# Patient Record
Sex: Female | Born: 1985 | Race: White | Hispanic: No | Marital: Married | Smoking: Current every day smoker
Health system: Southern US, Community
[De-identification: ages and names within clinical notes are randomized; demographics above are authoritative.]

## PROBLEM LIST (undated history)

## (undated) DIAGNOSIS — M1990 Unspecified osteoarthritis, unspecified site: Secondary | ICD-10-CM

## (undated) DIAGNOSIS — K5792 Diverticulitis of intestine, part unspecified, without perforation or abscess without bleeding: Secondary | ICD-10-CM

## (undated) HISTORY — DX: Unspecified osteoarthritis, unspecified site: M19.90

## (undated) HISTORY — PX: NO PAST SURGERIES: SHX2092

## (undated) HISTORY — DX: Diverticulitis of intestine, part unspecified, without perforation or abscess without bleeding: K57.92

---

## 2021-03-20 ENCOUNTER — Emergency Department

## 2021-03-20 ENCOUNTER — Emergency Department
Admission: EM | Admit: 2021-03-20 | Discharge: 2021-03-20 | Disposition: A | Discharge status: Home / Self Care | Attending: Emergency Medicine | Admitting: Emergency Medicine

## 2021-03-20 ENCOUNTER — Other Ambulatory Visit: Payer: Self-pay

## 2021-03-20 ENCOUNTER — Encounter: Payer: Self-pay | Admitting: Emergency Medicine

## 2021-03-20 DIAGNOSIS — W01198A Fall on same level from slipping, tripping and stumbling with subsequent striking against other object, initial encounter: Secondary | ICD-10-CM | POA: Diagnosis not present

## 2021-03-20 DIAGNOSIS — S40011A Contusion of right shoulder, initial encounter: Secondary | ICD-10-CM | POA: Insufficient documentation

## 2021-03-20 DIAGNOSIS — Y990 Civilian activity done for income or pay: Secondary | ICD-10-CM | POA: Insufficient documentation

## 2021-03-20 DIAGNOSIS — F17210 Nicotine dependence, cigarettes, uncomplicated: Secondary | ICD-10-CM | POA: Diagnosis not present

## 2021-03-20 DIAGNOSIS — S8001XA Contusion of right knee, initial encounter: Secondary | ICD-10-CM | POA: Insufficient documentation

## 2021-03-20 DIAGNOSIS — S8991XA Unspecified injury of right lower leg, initial encounter: Secondary | ICD-10-CM | POA: Diagnosis present

## 2021-03-20 MED ORDER — IBUPROFEN 400 MG PO TABS
400.0000 mg | ORAL_TABLET | Freq: Once | ORAL | Status: AC
  Administered 2021-03-20: 400 mg via ORAL
  Filled 2021-03-20: qty 1

## 2021-03-20 MED ORDER — ACETAMINOPHEN 500 MG PO TABS
1000.0000 mg | ORAL_TABLET | Freq: Once | ORAL | Status: AC
  Administered 2021-03-20: 1000 mg via ORAL
  Filled 2021-03-20: qty 2

## 2021-03-20 NOTE — ED Notes (Signed)
Pt states this incident is workers comp, per profile nothing is needed only upon request. Pt states supervisor did not need any testing done at this time.

## 2021-03-20 NOTE — ED Triage Notes (Signed)
Pt comes into the ED via POV c/o fall at work.  Pt states she was getting chased by a dog and tripped backwards and fell into a tree.  Pt c/o right shoulder and right knee pain.  Pt ambulatory to triage with no deformities noted.

## 2021-03-20 NOTE — ED Provider Notes (Signed)
[facility] Emergency Department Provider Note  ____________________________________________   Event Date/Time   First MD Initiated Contact with Patient 03/20/21 1355     (approximate)  I have reviewed the triage vital signs and the nursing notes.   HISTORY  Chief Complaint Fall   HPI Allison Navarro is a 36 y.o. female without significant past medical history who presents for assessment of some right knee and right shoulder pain after she tripped and fell trying to escape from a dog chasing her earlier today.  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  She thinks she may have tripped on a log that she fell onto her right knee and the back of her right shoulder.  She does not think her hit her head and did not have LOC.  She does not have any neck pain or other back pain or other extremity pain.  She states that prior to that she was in her usual state of health without any recent fevers, chills, cough, nausea, vomiting, Derica dysuria, rash or any other recent sick symptoms or injuries or falls.  She is not any blood thinners and does not take any analgesia prior to arrival.       History reviewed. No pertinent past medical history.  There are no problems to display for this patient.   History reviewed. No pertinent surgical history.  Prior to Admission medications   Not on File    Allergies Patient has no known allergies.  History reviewed. No pertinent family history.  Social History Social History   Tobacco Use   Smoking status: Every Day    Pack years: 0.00    Types: Cigarettes   Smokeless tobacco: Never    Review of Systems  Review of Systems  Constitutional:  Negative for chills and fever.  HENT:  Negative for sore throat.   Eyes:  Negative for pain.  Respiratory:  Negative for cough and stridor.   Cardiovascular:  Negative for chest pain.  Gastrointestinal:   Negative for vomiting.  Genitourinary:  Negative for dysuria.  Musculoskeletal:  Positive for joint pain (R knee, R shoulder) and myalgias (R shoulder, R knee).  Skin:  Negative for rash.  Neurological:  Negative for seizures, loss of consciousness and headaches.  Psychiatric/Behavioral:  Negative for suicidal ideas.   All other systems reviewed and are negative.    ____________________________________________   PHYSICAL EXAM:  VITAL SIGNS: ED Triage Vitals  Enc Vitals Group     BP 03/20/21 1345 (!) 143/81     Pulse Rate 03/20/21 1345 73     Resp 03/20/21 1345 18     Temp 03/20/21 1345 98.4 F (36.9 C)     Temp Source 03/20/21 1345 Oral     SpO2 03/20/21 1345 98 %     Weight 03/20/21 1345 165 lb (74.8 kg)     Height 03/20/21 1345 5' (1.524 m)     Head Circumference --      Peak Flow --      Pain Score 03/20/21 1345 8     Pain Loc --      Pain Edu? --      Excl. in [place]? --    Vitals:   03/20/21 1345  BP: (!) 143/81  Pulse: 73  Resp: 18  Temp: 98.4 F (36.9 C)  SpO2: 98%   Physical Exam Vitals and nursing note reviewed.  Constitutional:  General: She is not in acute distress.    Appearance: She is well-developed.  HENT:     Head: Normocephalic and atraumatic.     Right Ear: External ear normal.     Left Ear: External ear normal.     Nose: Nose normal.  Eyes:     Conjunctiva/sclera: Conjunctivae normal.  Cardiovascular:     Rate and Rhythm: Normal rate and regular rhythm.     Heart sounds: No murmur heard. Pulmonary:     Effort: Pulmonary effort is normal. No respiratory distress.     Breath sounds: Normal breath sounds.  Abdominal:     Palpations: Abdomen is soft.     Tenderness: There is no abdominal tenderness.  Musculoskeletal:     Cervical back: Neck supple.  Skin:    General: Skin is warm and dry.     Capillary Refill: Capillary refill takes less than 2 seconds.  Neurological:     Mental Status: She is alert and oriented to person, place, and  time.    No tenderness step-offs or deformities over the C/T/L-spine.  2+ radial pulses.  Patient has full strength on her bilateral upper and lower extremities although she is from out of pain on ranging her right arm overhead.  She is tender over her posterior right shoulder a little bit over the [facility] joint but there is no deformity effusion or other evidence of trauma.  Sensation is intact light touch throughout all extremities.  Patient has some mild tenderness over her medial collateral ligaments on the right knee without any laxity on valgus, varus or anterior posterior drawer testing.  No other obvious trauma to the lower extremities. ____________________________________________   LABS (all labs ordered are listed, but only abnormal results are displayed)  Labs Reviewed - No data to display ____________________________________________  EKG  ____________________________________________  RADIOLOGY  ED MD interpretation: No evidence of fracture dislocation or other clear acute injury on plain film of the right shoulder and right knee.  Official radiology report(s): DG Shoulder Right  Result Date: 03/20/2021 CLINICAL DATA:  Provided history: Right shoulder pain. Additional history provided: Patient reports fall hitting right shoulder and twisting right knee. Patient reports pain to anterior right knee and posterior right shoulder. EXAM: RIGHT SHOULDER - 2+ VIEW COMPARISON:  No pertinent prior exams available for comparison. FINDINGS: There is normal bony alignment. No evidence of acute osseous or articular abnormality. The joint spaces are maintained. IMPRESSION: No evidence of acute osseous or articular abnormality. Electronically Signed   By: Kellie Simmering DO   On: 03/20/2021 14:44   DG Knee Complete 4 Views Right  Result Date: 03/20/2021 CLINICAL DATA:  Right shoulder pain. Additional history provided: Patient reports fall hitting right shoulder and twisting right knee. Patient reports  pain to anterior right knee and posterior right shoulder. EXAM: RIGHT KNEE - COMPLETE 4+ VIEW COMPARISON:  No pertinent prior exams available for comparison. FINDINGS: There is normal bony alignment. No evidence of acute osseous or articular abnormality. The joint spaces are maintained. IMPRESSION: No evidence of acute osseous or articular abnormality. Electronically Signed   By: Kellie Simmering DO   On: 03/20/2021 14:47    ____________________________________________   PROCEDURES  Procedure(s) performed (including Critical Care):  Procedures   ____________________________________________   INITIAL IMPRESSION / ASSESSMENT AND PLAN / ED COURSE      Patient presents with above-stated history exam for some soreness in her right shoulder which she fell onto in her right knee after ground-level mechanical  fall described above.  On arrival she is afebrile and hemodynamically stable.  She does have little tenderness in the medial aspect of the right knee and the posterior shoulder pain on passive range of motion but otherwise is neurovascularly intact without other evidence of trauma.  Plain films are unremarkable.  Suspect contusion.  Low suspicion for occult orthopedic or any significant other visceral injury.  Patient given Tylenol and ibuprofen and instructed follow-up with PCP as needed for any persistent soreness.  Discharged stable condition.  Strict return precautions advised and discussed.       ____________________________________________   FINAL CLINICAL IMPRESSION(S) / ED DIAGNOSES  Final diagnoses:  Contusion of right knee, initial encounter  Contusion of right shoulder, initial encounter    Medications  acetaminophen (TYLENOL) tablet 1,000 mg (1,000 mg Oral Given 03/20/21 1448)  ibuprofen (ADVIL) tablet 400 mg (400 mg Oral Given 03/20/21 1448)     ED Discharge Orders     None        Note:  This document was prepared using Dragon voice recognition software and may  include unintentional dictation errors.    Lucrezia Starch, MD 03/20/21 1520

## 2021-04-18 ENCOUNTER — Ambulatory Visit: Payer: BLUE CROSS/BLUE SHIELD | Admitting: Family Medicine

## 2021-10-08 ENCOUNTER — Ambulatory Visit (INDEPENDENT_AMBULATORY_CARE_PROVIDER_SITE_OTHER): Payer: BLUE CROSS/BLUE SHIELD | Admitting: Internal Medicine

## 2021-10-08 ENCOUNTER — Other Ambulatory Visit: Payer: Self-pay

## 2021-10-08 ENCOUNTER — Encounter: Payer: Self-pay | Admitting: Internal Medicine

## 2021-10-08 VITALS — BP 122/77 | HR 66 | Ht 59.0 in

## 2021-10-08 DIAGNOSIS — Z124 Encounter for screening for malignant neoplasm of cervix: Secondary | ICD-10-CM

## 2021-10-08 DIAGNOSIS — K5792 Diverticulitis of intestine, part unspecified, without perforation or abscess without bleeding: Secondary | ICD-10-CM | POA: Diagnosis not present

## 2021-10-08 MED ORDER — GLYCOPYRROLATE 2 MG PO TABS: 2.0000 mg | ORAL_TABLET | Freq: Three times a day (TID) | ORAL | 3 refills | Status: DC

## 2021-10-08 NOTE — Assessment & Plan Note (Signed)
Refer to GI 

## 2021-10-08 NOTE — Assessment & Plan Note (Signed)
Refer to OB/GYN 

## 2021-10-08 NOTE — Progress Notes (Signed)
New Patient Office Visit  Subjective:  Patient ID: Allison Navarro, female    DOB: 16-Mar-1986  Age: 36 y.o. MRN: [id number]  CC:  Chief Complaint  Patient presents with   New Patient (Initial Visit)    Abdominal Pain This is a chronic problem. The current episode started 1 to 4 weeks ago. The problem has been gradually improving. The pain is at a severity of 7/10. The pain is moderate. The quality of the pain is dull and aching. The abdominal pain radiates to the LLQ. Associated symptoms include belching, constipation and flatus. Pertinent negatives include no anorexia, fever, headaches, hematochezia, hematuria or weight loss.  Patient presents for new pt visit  Past Medical History:  Diagnosis Date   Diverticulitis      Current Outpatient Medications:    glycopyrrolate (ROBINUL) 2 MG tablet, Take 1 tablet (2 mg total) by mouth 3 (three) times daily., Disp: 90 tablet, Rfl: 3   History reviewed. No pertinent surgical history.  History reviewed. No pertinent family history.  Social History   Socioeconomic History   Marital status: Married    Spouse name: Biomedical engineer   Number of children: 2   Years of education: Not on file   Highest education level: GED or equivalent  Occupational History   Not on file  Tobacco Use   Smoking status: Every Day    Types: Cigarettes   Smokeless tobacco: Never  Substance and Sexual Activity   Alcohol use: Never   Drug use: Never   Sexual activity: Yes  Other Topics Concern   Not on file  Social History Narrative   Not on file   Social Determinants of Health   Financial Resource Strain: Not on file  Food Insecurity: Not on file  Transportation Needs: Not on file  Physical Activity: Not on file  Stress: Not on file  Social Connections: Not on file  Intimate Partner Violence: Not on file    ROS Review of Systems  Constitutional:  Negative for fever and weight loss.  Gastrointestinal:  Positive for abdominal pain,  constipation and flatus. Negative for anorexia and hematochezia.  Genitourinary:  Negative for hematuria.  Neurological:  Negative for headaches.   Objective:   Today's Vitals: BP 122/77    Pulse 66    Ht 4\' 11"  (1.499 m)    BMI 33.33 kg/m   Physical Exam Constitutional:      Appearance: Normal appearance. She is not ill-appearing.  HENT:     Head: Normocephalic.     Nose: Nose normal.     Mouth/Throat:     Mouth: Mucous membranes are moist.  Eyes:     Pupils: Pupils are equal, round, and reactive to light.  Cardiovascular:     Rate and Rhythm: Normal rate.  Pulmonary:     Effort: Pulmonary effort is normal.  Abdominal:     General: Abdomen is flat.     Palpations: Abdomen is soft.  Musculoskeletal:     Cervical back: Neck supple.  Skin:    General: Skin is warm.  Neurological:     Mental Status: She is alert.  Psychiatric:        Mood and Affect: Mood normal.    Assessment & Plan:   Problem List Items Addressed This Visit       Other   Cervical cancer screening - Primary    Refer to OB/GYN      Relevant Orders   Ambulatory referral to Obstetrics / Gynecology  Diverticulitis    Refer to GI      Relevant Orders   Ambulatory referral to Gastroenterology    Outpatient Encounter Medications as of 10/08/2021  Medication Sig   [DISCONTINUED] glycopyrrolate (ROBINUL) 2 MG tablet Take by mouth 3 (three) times daily.   glycopyrrolate (ROBINUL) 2 MG tablet Take 1 tablet (2 mg total) by mouth 3 (three) times daily.   No facility-administered encounter medications on file as of 10/08/2021.    Follow-up: No follow-ups on file.   Cletis Athens, MD

## 2021-11-26 ENCOUNTER — Ambulatory Visit: Payer: BLUE CROSS/BLUE SHIELD | Admitting: Obstetrics

## 2021-11-26 ENCOUNTER — Other Ambulatory Visit (HOSPITAL_COMMUNITY)
Admission: RE | Admit: 2021-11-26 | Discharge: 2021-11-26 | Disposition: A | Discharge status: Home / Self Care | Payer: BLUE CROSS/BLUE SHIELD | Source: Ambulatory Visit | Attending: Obstetrics | Admitting: Obstetrics

## 2021-11-26 ENCOUNTER — Encounter: Payer: Self-pay | Admitting: Obstetrics

## 2021-11-26 ENCOUNTER — Other Ambulatory Visit: Payer: Self-pay

## 2021-11-26 VITALS — BP 104/72 | Ht 60.0 in | Wt 160.0 lb

## 2021-11-26 DIAGNOSIS — Z01419 Encounter for gynecological examination (general) (routine) without abnormal findings: Secondary | ICD-10-CM

## 2021-11-26 DIAGNOSIS — D229 Melanocytic nevi, unspecified: Secondary | ICD-10-CM | POA: Diagnosis not present

## 2021-11-26 NOTE — Progress Notes (Signed)
SUBJECTIVE ? ?HPI ? ?Allison Navarro is a 36 y.o.-year-old female who presents for an annual gynecological exam and Pap today. She was recently seen by her PCP. [place] and would like to establish care. She currently has a Mirena IUD for contraception and is pleased with that. She does not get periods. She denies any unusual vaginal bleeding, discharge, pelvic pain, and any other health concerns. She would like routine labs. ? ?Medical/Surgical History ?Past Medical History:  ?Diagnosis Date  ? Diverticulitis   ? ?No past surgical history on file. ? ?Social History ?Lives with husband and 2 children ?Works as a Development worker, community carrier ?Exercise: walking at work ?Substances: Occasional EtOH; smokes "less than a pack a day", denies smokeless tobacco, vape, recreational drugs ? ?Obstetric History ?OB History   ? ? Gravida  ?2  ? Para  ?2  ? Term  ?2  ? Preterm  ?   ? AB  ?   ? Living  ?   ?  ? ? SAB  ?   ? IAB  ?   ? Ectopic  ?   ? Multiple  ?   ? Live Births  ?2  ?   ?  ?  ?  ? ?GYN/Menstrual History ?No LMP recorded. (Menstrual status: IUD). ?Last Pap: unsure. Distant history of abnormal Pap, more recent ones have been normal ?Contraception: Mirena IUD ? ?Prevention ?Encouraged regular dental and eye exams ?Mammogram: at 51 ?STI screening: declines ? ?Current Medications ?Outpatient Medications Prior to Visit  ?Medication Sig  ? glycopyrrolate (ROBINUL) 2 MG tablet Take 1 tablet (2 mg total) by mouth 3 (three) times daily.  ? levonorgestrel (MIRENA) 20 MCG/DAY IUD 1 each by Intrauterine route once.  ? ?No facility-administered medications prior to visit.  ?  ? ? [facility] - 11/26/21 1009   ? ?  ? Pregnancy Intention Screening  ? Does the patient want to become pregnant in the next year? No   ? Does the patient's partner want to become pregnant in the next year? No   ? Would the patient like to discuss contraceptive options today? No   ?  ? Contraception Wrap Up  ? Current Method IUD or IUS   ? End Method  IUD or IUS   ? ?  ?  ? ?  ? ?The pregnancy intention screening data noted above was reviewed. Potential methods of contraception were discussed. The patient elected to proceed with IUD or IUS.  ? ?ROS ?History obtained from the patient ?General ROS: negative for - chills, fever, or sleep disturbance ?Psychological ROS: negative for - anxiety or depression ?Ophthalmic ROS: negative for - blurry vision or loss of vision ?Hematological and Lymphatic ROS: negative for - swollen lymph nodes ?Endocrine ROS: negative for - breast changes, palpitations, or unexpected weight changes ?Breast ROS: negative for breast lumps ?Respiratory ROS: no cough, shortness of breath, or wheezing ?Cardiovascular ROS: no chest pain or dyspnea on exertion ?Gastrointestinal ROS: no abdominal pain, change in bowel habits, or black or bloody stools ?Has flare-ups r/t diverticulitis about once a year ?Genito-Urinary ROS: no dysuria, trouble voiding, or hematuria ?Musculoskeletal ROS: negative for - joint pain or joint swelling ?Dermatological ROS: negative for dry skin and eczema ? ?Depression screen [facility] 2/9 10/08/2021  ?Decreased Interest 0  ?Down, Depressed, Hopeless 0  ?PHQ - 2 Score 0  ?  ? ?OBJECTIVE ? ?Last Weight  Most recent update: 11/26/2021 10:08 AM  ? ? Weight  ?72.6  kg (160 lb)  ?      ? ?  ?  ?Body mass index is 31.25 kg/m?.  ? ? ?BP 104/72   Ht 5' (1.524 m)   Wt 160 lb (72.6 kg)   BMI 31.25 kg/m?  ?General appearance: alert, cooperative, and appears stated age ?Head: Normocephalic, without obvious abnormality, atraumatic ?Eyes: negative findings: lids and lashes normal and conjunctivae and sclerae normal ?Neck: no adenopathy, supple, symmetrical, trachea midline, and thyroid not enlarged, symmetric, no tenderness/mass/nodules ?Back: symmetric, no curvature. ROM normal. No CVA tenderness. ?Lungs: clear to auscultation bilaterally ?Breasts: normal appearance, no masses or tenderness, No nipple retraction or dimpling, No axillary or  supraclavicular adenopathy, Normal to palpation without dominant masses ?Heart: regular rate and rhythm, S1, S2 normal, no murmur, click, rub or gallop ?Abdomen: soft, non-tender; bowel sounds normal; no masses,  no organomegaly ?Pelvic: cervix normal in appearance, external genitalia normal, no cervical motion tenderness, vagina normal without discharge, and Pap collected ?Extremities: extremities normal, atraumatic, no cyanosis or edema ?Pulses: 2+ and symmetric ?Skin:  Skin color, texture, and turgor normal. No rashes. Small, flat,dark multicolored (dark brown, dark blue) nevus noted on left hand. ?Lymph nodes: Cervical, supraclavicular, and axillary nodes normal. ? ?ASSESSMENT  ?1) Annual exam ?2) Pap due ?3) Suspicious mole ? ?PLAN ?1) Physical exam as noted. Pap collected. CBC, A1C, lipid panel, and TSH ordered. ?2) Pap collected. F/u based on results. ?3) Referral to dermatology. ? ?Return in one year for annual exam or as needed for concerns. ? ? ?Lloyd Huger, CNM  ?

## 2021-11-27 LAB — CBC WITH DIFFERENTIAL/PLATELET
Basophils Absolute: 0.1 10*3/uL (ref 0.0–0.2)
Basos: 1 %
EOS (ABSOLUTE): 0.2 10*3/uL (ref 0.0–0.4)
Eos: 2 %
Hematocrit: 45 % (ref 34.0–46.6)
Hemoglobin: 15.1 g/dL (ref 11.1–15.9)
Immature Grans (Abs): 0 10*3/uL (ref 0.0–0.1)
Immature Granulocytes: 0 %
Lymphocytes Absolute: 3.1 10*3/uL (ref 0.7–3.1)
Lymphs: 36 %
MCH: 30.6 pg (ref 26.6–33.0)
MCHC: 33.6 g/dL (ref 31.5–35.7)
MCV: 91 fL (ref 79–97)
Monocytes Absolute: 0.7 10*3/uL (ref 0.1–0.9)
Monocytes: 8 %
Neutrophils Absolute: 4.4 10*3/uL (ref 1.4–7.0)
Neutrophils: 53 %
Platelets: 372 10*3/uL (ref 150–450)
RBC: 4.93 x10E6/uL (ref 3.77–5.28)
RDW: 12.1 % (ref 11.7–15.4)
WBC: 8.4 10*3/uL (ref 3.4–10.8)

## 2021-11-27 LAB — LIPID PANEL
Chol/HDL Ratio: 6.1 ratio — ABNORMAL HIGH (ref 0.0–4.4)
Cholesterol, Total: 194 mg/dL (ref 100–199)
HDL: 32 mg/dL — ABNORMAL LOW (ref >39)
LDL Chol Calc (NIH): 145 mg/dL — ABNORMAL HIGH (ref 0–99)
Triglycerides: 90 mg/dL (ref 0–149)
VLDL Cholesterol Cal: 17 mg/dL (ref 5–40)

## 2021-11-27 LAB — TSH: TSH: 1.46 u[IU]/mL (ref 0.450–4.500)

## 2021-11-28 ENCOUNTER — Encounter: Payer: Self-pay | Admitting: Obstetrics

## 2021-11-28 LAB — CYTOLOGY - PAP
Adequacy: ABSENT
Comment: NEGATIVE
High risk HPV: NEGATIVE

## 2021-12-01 ENCOUNTER — Encounter: Payer: Self-pay | Admitting: Gastroenterology

## 2021-12-01 ENCOUNTER — Other Ambulatory Visit: Payer: Self-pay

## 2021-12-01 ENCOUNTER — Ambulatory Visit (INDEPENDENT_AMBULATORY_CARE_PROVIDER_SITE_OTHER): Payer: BLUE CROSS/BLUE SHIELD | Admitting: Gastroenterology

## 2021-12-01 VITALS — BP 108/69 | HR 61 | Temp 98.1°F | Ht 60.0 in | Wt 164.0 lb

## 2021-12-01 DIAGNOSIS — K5732 Diverticulitis of large intestine without perforation or abscess without bleeding: Secondary | ICD-10-CM | POA: Diagnosis not present

## 2021-12-01 MED ORDER — NA SULFATE-K SULFATE-MG SULF 17.5-3.13-1.6 GM/177ML PO SOLN: 1.0000 | Freq: Once | ORAL | 0 refills | Status: AC

## 2021-12-01 NOTE — Progress Notes (Signed)
? ? ?Gastroenterology Consultation ? ?Referring Provider:     Cletis Athens, MD ?Primary Care Physician:  Pcp, No ?Primary Gastroenterologist:  Dr. Allen Navarro     ?Reason for Consultation:     Diverticulitis ?      ? HPI:   ?Allison Navarro is a 36 y.o. y/o female referred for consultation & management of Diverticulitis by Dr. Merryl Navarro, No.  This patient comes in today with a history of diverticulitis.  The patient denies ever having colonoscopy in the past and cannot remember having a CT scan when she had one of her spells of diverticulitis.  The patient states that she has one attack a year on average.  She also states she has been stable way from seeds and nuts and popcorn.  She had her last attack this past December.  There is no report of any unexplained weight loss fevers chills nausea vomiting black stools or bloody stools.  She does report that she passes some mucous-like material every once in a while.  There is no report of any fevers or chills at the present time. 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 ? ?Past Medical History:  ?Diagnosis Date  ? Diverticulitis   ? ? ?No past surgical history on file. ? ?Prior to Admission medications   ?Medication Sig Start Date End Date Taking? Authorizing Provider  ?glycopyrrolate (ROBINUL) 2 MG tablet Take 1 tablet (2 mg total) by mouth 3 (three) times daily. 10/08/21  Yes Allison Navarro, Allison Shove, MD  ?levonorgestrel (MIRENA) 20 MCG/DAY IUD 1 each by Intrauterine route once.   Yes [provider]  ? ? ?No family history on file.  ? ?Social History  ? ?Tobacco Use  ? Smoking status: Every Day  ?  Types: Cigarettes  ? Smokeless tobacco: Never  ?Substance Use Topics  ? Alcohol use: Not Currently  ? Drug use: Never  ? ? ?Allergies as of 12/01/2021  ? (No Known Allergies)  ? ? ?Review of Systems:    ?All systems reviewed and negative except where noted in HPI. ? ? Physical Exam:  ?BP 108/69   Pulse 61   Temp 98.1 ?F (36.7 ?C)  (Oral)   Ht 5' (1.524 m)   Wt 164 lb (74.4 kg)   BMI 32.03 kg/m?  ?No LMP recorded. (Menstrual status: IUD). ?General:   Alert,  Well-developed, well-nourished, pleasant and cooperative in NAD ?Head:  Normocephalic and atraumatic. ?Eyes:  Sclera clear, no icterus.   Conjunctiva pink. ?Ears:  Normal auditory acuity. ?Neck:  Supple; no masses or thyromegaly. ?Lungs:  Respirations even and unlabored.  Clear throughout to auscultation.   No wheezes, crackles, or rhonchi. No acute distress. ?Heart:  Regular rate and rhythm; no murmurs, clicks, rubs, or gallops. ?Abdomen:  Normal bowel sounds.  No bruits.  Soft, non-tender and non-distended without masses, hepatosplenomegaly or hernias noted.  No guarding or rebound tenderness.  Negative Carnett sign.   ?Rectal:  Deferred.  ?Pulses:  Normal pulses noted. ?Extremities:  No clubbing or edema.  No cyanosis. ?Neurologic:  Alert and oriented x3;  grossly normal neurologically. ?Skin:  Intact without significant lesions or rashes.  No jaundice. ?Lymph Nodes:  No significant cervical adenopathy. ?Psych:  Alert and cooperative. Normal mood and affect. ? ?Imaging Studies: ?No results found. ? ?Assessment and Plan:  ? ?Allison Navarro is a 36 y.o. y/o female who comes in today with a history of recurrent diverticulitis without any history of colonoscopies  in the past or imaging that she can recall.  The patient has been told that she should be set up for colonoscopy if she is never had a colonoscopy to make sure that it is just diverticulosis and not something else is causing her pain.  She has also been told that we can also try and assess the extent of the diverticulosis to see if his pan colitis or just the left side.  The patient has also been told that she may need a CT scan at the time of the next attack so that we can confirm that that is actually the cause of her symptoms.  The patient has been explained the plan and will follow up at time of colonoscopy. ? ? ? ?Allison Lame, MD. Allison Navarro ? ? ? Note: This dictation was prepared with Dragon dictation along with smaller phrase technology. Any transcriptional errors that result from this process are unintentional.   ?

## 2022-01-06 ENCOUNTER — Ambulatory Visit: Payer: BLUE CROSS/BLUE SHIELD | Admitting: Certified Registered"

## 2022-01-06 ENCOUNTER — Ambulatory Visit
Admission: RE | Admit: 2022-01-06 | Discharge: 2022-01-06 | Disposition: A | Discharge status: Home / Self Care | Payer: BLUE CROSS/BLUE SHIELD | Attending: Gastroenterology | Admitting: Gastroenterology

## 2022-01-06 ENCOUNTER — Other Ambulatory Visit: Payer: Self-pay

## 2022-01-06 ENCOUNTER — Encounter: Payer: Self-pay | Admitting: Gastroenterology

## 2022-01-06 ENCOUNTER — Encounter
Admission: RE | Disposition: A | Discharge status: Home / Self Care | Payer: Self-pay | Source: Home / Self Care | Attending: Gastroenterology

## 2022-01-06 DIAGNOSIS — K635 Polyp of colon: Secondary | ICD-10-CM | POA: Diagnosis not present

## 2022-01-06 DIAGNOSIS — K5730 Diverticulosis of large intestine without perforation or abscess without bleeding: Secondary | ICD-10-CM | POA: Insufficient documentation

## 2022-01-06 DIAGNOSIS — K5732 Diverticulitis of large intestine without perforation or abscess without bleeding: Secondary | ICD-10-CM | POA: Diagnosis not present

## 2022-01-06 DIAGNOSIS — Z09 Encounter for follow-up examination after completed treatment for conditions other than malignant neoplasm: Secondary | ICD-10-CM | POA: Insufficient documentation

## 2022-01-06 DIAGNOSIS — F17210 Nicotine dependence, cigarettes, uncomplicated: Secondary | ICD-10-CM | POA: Insufficient documentation

## 2022-01-06 HISTORY — PX: COLONOSCOPY WITH PROPOFOL: SHX5780

## 2022-01-06 LAB — POCT PREGNANCY, URINE: Preg Test, Ur: NEGATIVE

## 2022-01-06 SURGERY — COLONOSCOPY WITH PROPOFOL
Anesthesia: General

## 2022-01-06 MED ORDER — MIDAZOLAM HCL 5 MG/5ML IJ SOLN
INTRAMUSCULAR | Status: DC | PRN
  Administered 2022-01-06: 2 mg via INTRAVENOUS

## 2022-01-06 MED ORDER — GLYCOPYRROLATE 0.2 MG/ML IJ SOLN
INTRAMUSCULAR | Status: AC
  Filled 2022-01-06: qty 1

## 2022-01-06 MED ORDER — SODIUM CHLORIDE 0.9 % IV SOLN: INTRAVENOUS | Status: DC

## 2022-01-06 MED ORDER — GLYCOPYRROLATE 0.2 MG/ML IJ SOLN
INTRAMUSCULAR | Status: DC | PRN
  Administered 2022-01-06: .2 mg via INTRAVENOUS

## 2022-01-06 MED ORDER — PROPOFOL 10 MG/ML IV BOLUS
INTRAVENOUS | Status: DC | PRN
  Administered 2022-01-06: 70 mg via INTRAVENOUS

## 2022-01-06 MED ORDER — LIDOCAINE 2% (20 MG/ML) 5 ML SYRINGE
INTRAMUSCULAR | Status: DC | PRN
  Administered 2022-01-06: 20 mg via INTRAVENOUS

## 2022-01-06 MED ORDER — MIDAZOLAM HCL 2 MG/2ML IJ SOLN
INTRAMUSCULAR | Status: AC
  Filled 2022-01-06: qty 2

## 2022-01-06 MED ORDER — PROPOFOL 500 MG/50ML IV EMUL
INTRAVENOUS | Status: DC | PRN
  Administered 2022-01-06: 120 ug/kg/min via INTRAVENOUS

## 2022-01-06 NOTE — H&P (Signed)
? ?  [name], MD [facility] ?[address] ?[place], [place] [address] ?Phone:[phone] ?Fax : [phone] ? ?Primary Care Physician:  Pcp, No ?Primary Gastroenterologist:  Dr. Allen Norris ? ?Pre-Procedure History & Physical: ?HPI:  Allison Navarro is a 36 y.o. female is here for an colonoscopy. ?  ?Past Medical History:  ?Diagnosis Date  ? Diverticulitis   ? ? ?Past Surgical History:  ?Procedure Laterality Date  ? NO PAST SURGERIES    ? ? ?Prior to Admission medications   ?Medication Sig Start Date End Date Taking? Authorizing Provider  ?glycopyrrolate (ROBINUL) 2 MG tablet Take 1 tablet (2 mg total) by mouth 3 (three) times daily. 10/08/21  Yes Masoud, Viann Shove, MD  ?levonorgestrel (MIRENA) 20 MCG/DAY IUD 1 each by Intrauterine route once.    [provider]  ? ? ?Allergies as of 01/05/2022  ? (No Known Allergies)  ? ? ?History reviewed. No pertinent family history. ? ?Social History  ? ?Socioeconomic History  ? Marital status: Married  ?  Spouse name: steven Cure  ? Number of children: 2  ? Years of education: Not on file  ? Highest education level: GED or equivalent  ?Occupational History  ? Not on file  ?Tobacco Use  ? Smoking status: Every Day  ?  Types: Cigarettes  ? Smokeless tobacco: Never  ?Vaping Use  ? Vaping Use: Never used  ?Substance and Sexual Activity  ? Alcohol use: Not Currently  ? Drug use: Never  ? Sexual activity: Yes  ?  Birth control/protection: I.U.D.  ?Other Topics Concern  ? Not on file  ?Social History Narrative  ? Not on file  ? ?Social Determinants of Health  ? ?Financial Resource Strain: Not on file  ?Food Insecurity: Not on file  ?Transportation Needs: Not on file  ?Physical Activity: Not on file  ?Stress: Not on file  ?Social Connections: Not on file  ?Intimate Partner Violence: Not on file  ? ? ?Review of Systems: ?See HPI, otherwise negative ROS ? ?Physical Exam: ?BP 112/71   Pulse 78   Temp (!) 97.5 ?F (36.4 ?C) (Temporal)   Resp 18   Ht 5' (1.524 m)   Wt 72.6 kg    SpO2 97%   BMI 31.25 kg/m?  ?General:   Alert,  pleasant and cooperative in NAD ?Head:  Normocephalic and atraumatic. ?Neck:  Supple; no masses or thyromegaly. ?Lungs:  Clear throughout to auscultation.    ?Heart:  Regular rate and rhythm. ?Abdomen:  Soft, nontender and nondistended. Normal bowel sounds, without guarding, and without rebound.   ?Neurologic:  Alert and  oriented x4;  grossly normal neurologically. ? ?Impression/Plan: ?Allison Navarro is here for an colonoscopy to be performed for diverticulitis ? ?Risks, benefits, limitations, and alternatives regarding  colonoscopy have been reviewed with the patient.  Questions have been answered.  All parties agreeable. ? ? ?[name], MD  01/06/2022, 10:47 AM ?

## 2022-01-06 NOTE — Transfer of Care (Signed)
Immediate Anesthesia Transfer of Care Note ? ?Patient: Allison Navarro ? ?Procedure(s) Performed: COLONOSCOPY WITH PROPOFOL ? ?Patient Location: Endoscopy Unit ? ?Anesthesia Type:General ? ?Level of Consciousness: awake ? ?Airway & Oxygen Therapy: Patient Spontanous Breathing ? ?Post-op Assessment: Report given to RN and Post -op Vital signs reviewed and stable ? ?Post vital signs: Reviewed ? ?Last Vitals:  ?Vitals Value Taken Time  ?BP 95/72 01/06/22 1111  ?Temp 36.4 ?C 01/06/22 1110  ?Pulse 96 01/06/22 1114  ?Resp 28 01/06/22 1114  ?SpO2 93 % 01/06/22 1114  ?Vitals shown include unvalidated device data. ? ?Last Pain:  ?Vitals:  ? 01/06/22 1110  ?TempSrc: Temporal  ?PainSc:   ?   ? ?  ? ?Complications: No notable events documented. ?

## 2022-01-06 NOTE — Anesthesia Preprocedure Evaluation (Signed)
Anesthesia Evaluation  ?Patient identified by MRN, date of birth, ID band ?Patient awake ? ? ? ?Reviewed: ?Allergy & Precautions, H&P , NPO status , Patient's Chart, lab work & pertinent test results, reviewed documented beta blocker date and time  ? ?History of Anesthesia Complications ?Negative for: history of anesthetic complications ? ?Airway ?Mallampati: I ? ?TM Distance: >3 FB ?Neck ROM: full ? ? ? Dental ? ?(+) Dental Advidsory Given, Teeth Intact, Caps, Missing ?  ?Pulmonary ?neg shortness of breath, neg sleep apnea, neg COPD, neg recent URI, Current Smoker,  ?  ?Pulmonary exam normal ?breath sounds clear to auscultation ? ? ? ? ? ? Cardiovascular ?Exercise Tolerance: Good ?negative cardio ROS ?Normal cardiovascular exam ?Rhythm:regular Rate:Normal ? ? ?  ?Neuro/Psych ?negative neurological ROS ? negative psych ROS  ? GI/Hepatic ?negative GI ROS, Neg liver ROS,   ?Endo/Other  ?negative endocrine ROS ? Renal/GU ?negative Renal ROS  ?negative genitourinary ?  ?Musculoskeletal ? ? Abdominal ?  ?Peds ? Hematology ?negative hematology ROS ?(+)   ?Anesthesia Other Findings ?Past Medical History: ?No date: Diverticulitis ? ? Reproductive/Obstetrics ?negative OB ROS ? ?  ? ? ? ? ? ? ? ? ? ? ? ? ? ?  ?  ? ? ? ? ? ? ? ? ?Anesthesia Physical ?Anesthesia Plan ? ?ASA: 2 ? ?Anesthesia Plan: General  ? ?Post-op Pain Management:   ? ?Induction: Intravenous ? ?PONV Risk Score and Plan: 2 and Propofol infusion and TIVA ? ?Airway Management Planned: Natural Airway and Nasal Cannula ? ?Additional Equipment:  ? ?Intra-op Plan:  ? ?Post-operative Plan:  ? ?Informed Consent: I have reviewed the patients History and Physical, chart, labs and discussed the procedure including the risks, benefits and alternatives for the proposed anesthesia with the patient or authorized representative who has indicated his/her understanding and acceptance.  ? ? ? ?Dental Advisory Given ? ?Plan Discussed with:  Anesthesiologist, CRNA and Surgeon ? ?Anesthesia Plan Comments:   ? ? ? ? ? ? ?Anesthesia Quick Evaluation ? ?

## 2022-01-06 NOTE — Op Note (Signed)
[facility] ?Gastroenterology ?Patient Name: Allison Navarro ?Procedure Date: 01/06/2022 10:47 AM ?MRN: [id number] ?Account #: [id number] ?Date of Birth: 1986-04-07 ?Admit Type: Outpatient ?Age: 36 ?Room: [facility] ENDO ROOM 4 ?Gender: Female ?Note Status: Finalized ?Instrument Name: Colonoscope [id number] ?Procedure:             Colonoscopy ?Indications:           Follow-up of diverticulitis ?Providers:             [name] MD, MD ?Referring MD:          Forest Gleason Md, MD (Referring MD) ?Medicines:             Propofol per Anesthesia ?Complications:         No immediate complications. ?Procedure:             Pre-Anesthesia Assessment: ?                       - Prior to the procedure, a History and Physical was  ?                       performed, and patient medications and allergies were  ?                       reviewed. The patient's tolerance of previous  ?                       anesthesia was also reviewed. The risks and benefits  ?                       of the procedure and the sedation options and risks  ?                       were discussed with the patient. All questions were  ?                       answered, and informed consent was obtained. Prior  ?                       Anticoagulants: The patient has taken no previous  ?                       anticoagulant or antiplatelet agents. ASA Grade  ?                       Assessment: II - A patient with mild systemic disease.  ?                       After reviewing the risks and benefits, the patient  ?                       was deemed in satisfactory condition to undergo the  ?                       procedure. ?                       After obtaining informed consent, the colonoscope was  ?  passed under direct vision. Throughout the procedure,  ?                       the patient's blood pressure, pulse, and oxygen  ?                       saturations were monitored continuously. The  ?                       Colonoscope was  introduced through the anus and  ?                       advanced to the the cecum, identified by appendiceal  ?                       orifice and ileocecal valve. The colonoscopy was  ?                       performed without difficulty. The patient tolerated  ?                       the procedure well. The quality of the bowel  ?                       preparation was excellent. ?Findings: ?     The perianal and digital rectal examinations were normal. ?     A few small-mouthed diverticula were found in the sigmoid colon. ?     Two sessile polyps were found in the sigmoid colon. The polyps were 3 to  ?     4 mm in size. These polyps were removed with a cold snare. Resection and  ?     retrieval were complete. ?Impression:            - Diverticulosis in the sigmoid colon. ?                       - Two 3 to 4 mm polyps in the sigmoid colon, removed  ?                       with a cold snare. Resected and retrieved. ?Recommendation:        - Discharge patient to home. ?                       - Resume previous diet. ?                       - Continue present medications. ?                       - Await pathology results. ?                       - If the pathology report reveals adenomatous tissue,  ?                       then repeat the colonoscopy for surveillance in 7  ?                       years otherwie 10 years. ?Procedure Code(s):     ---  Professional --- ?                       [phone], Colonoscopy, flexible; with removal of  ?                       tumor(s), polyp(s), or other lesion(s) by snare  ?                       technique ?Diagnosis Code(s):     --- Professional --- ?                       K57.32, Diverticulitis of large intestine without  ?                       perforation or abscess without bleeding ?                       K63.5, Polyp of colon ?CPT copyright 2019 [employer]. All rights reserved. ?The codes documented in this report are preliminary and upon coder review may  ?be  revised to meet current compliance requirements. ?[name] MD, MD ?01/06/2022 11:09:09 AM ?This report has been signed electronically. ?Number of Addenda: 0 ?Note Initiated On: 01/06/2022 10:47 AM ?Scope Withdrawal Time: 0 hours 8 minutes 47 seconds  ?Total Procedure Duration: 0 hours 10 minutes 53 seconds  ?Estimated Blood Loss:  Estimated blood loss: none. ?     [facility] ?

## 2022-01-06 NOTE — Anesthesia Postprocedure Evaluation (Signed)
Anesthesia Post Note ? ?Patient: Allison Navarro ? ?Procedure(s) Performed: COLONOSCOPY WITH PROPOFOL ? ?Patient location during evaluation: Endoscopy ?Anesthesia Type: General ?Level of consciousness: awake and alert ?Pain management: pain level controlled ?Vital Signs Assessment: post-procedure vital signs reviewed and stable ?Respiratory status: spontaneous breathing, nonlabored ventilation, respiratory function stable and patient connected to nasal cannula oxygen ?Cardiovascular status: blood pressure returned to baseline and stable ?Postop Assessment: no apparent nausea or vomiting ?Anesthetic complications: no ? ? ?No notable events documented. ? ? ?Last Vitals:  ?Vitals:  ? 01/06/22 1130 01/06/22 1140  ?BP: 104/78 110/82  ?Pulse: 85 84  ?Resp: 13 12  ?Temp:    ?SpO2: 100% 100%  ?  ?Last Pain:  ?Vitals:  ? 01/06/22 1140  ?TempSrc:   ?PainSc: 0-No pain  ? ? ?  ?  ?  ?  ?  ?  ? ?Martha Clan ? ? ? ? ?

## 2022-01-07 ENCOUNTER — Encounter: Payer: Self-pay | Admitting: Gastroenterology

## 2022-01-07 LAB — SURGICAL PATHOLOGY

## 2022-01-09 ENCOUNTER — Encounter: Payer: Self-pay | Admitting: Gastroenterology

## 2022-05-25 ENCOUNTER — Ambulatory Visit: Payer: BLUE CROSS/BLUE SHIELD | Admitting: Dermatology

## 2022-06-08 ENCOUNTER — Other Ambulatory Visit: Payer: Self-pay | Admitting: Internal Medicine

## 2022-06-16 ENCOUNTER — Encounter: Payer: Self-pay | Admitting: Internal Medicine

## 2022-06-16 ENCOUNTER — Ambulatory Visit (INDEPENDENT_AMBULATORY_CARE_PROVIDER_SITE_OTHER): Payer: BLUE CROSS/BLUE SHIELD | Admitting: Internal Medicine

## 2022-06-16 VITALS — BP 121/76 | HR 88 | Ht 60.0 in | Wt 168.2 lb

## 2022-06-16 DIAGNOSIS — K5732 Diverticulitis of large intestine without perforation or abscess without bleeding: Secondary | ICD-10-CM

## 2022-06-16 DIAGNOSIS — E785 Hyperlipidemia, unspecified: Secondary | ICD-10-CM | POA: Diagnosis not present

## 2022-06-16 DIAGNOSIS — J301 Allergic rhinitis due to pollen: Secondary | ICD-10-CM

## 2022-06-16 DIAGNOSIS — Z720 Tobacco use: Secondary | ICD-10-CM | POA: Insufficient documentation

## 2022-06-16 DIAGNOSIS — E669 Obesity, unspecified: Secondary | ICD-10-CM

## 2022-06-16 DIAGNOSIS — F17200 Nicotine dependence, unspecified, uncomplicated: Secondary | ICD-10-CM | POA: Diagnosis not present

## 2022-06-16 MED ORDER — ATORVASTATIN CALCIUM 10 MG PO TABS: 10.0000 mg | ORAL_TABLET | Freq: Every day | ORAL | 3 refills | Status: DC

## 2022-06-16 NOTE — Progress Notes (Signed)
Established Patient Office Visit  Subjective:  Patient ID: Allison Navarro, female    DOB: [birth year]  Age: 36 y.o. MRN: [id number]  CC:  Chief Complaint  Patient presents with   lab results    HPI  Allison Navarro presents for dyslipidemia architecture  Past Medical History:  Diagnosis Date   Diverticulitis     Past Surgical History:  Procedure Laterality Date   COLONOSCOPY WITH PROPOFOL N/A 01/06/2022   Procedure: COLONOSCOPY WITH PROPOFOL;  Surgeon: Midge Minium, MD;  Location: [facility] ENDOSCOPY;  Service: Endoscopy;  Laterality: N/A;   NO PAST SURGERIES      History reviewed. No pertinent family history.  Social History   Socioeconomic History   Marital status: Married    Spouse name: Agricultural engineer   Number of children: 2   Years of education: Not on file   Highest education level: GED or equivalent  Occupational History   Not on file  Tobacco Use   Smoking status: Every Day    Types: Cigarettes   Smokeless tobacco: Never  Vaping Use   Vaping Use: Never used  Substance and Sexual Activity   Alcohol use: Not Currently   Drug use: Never   Sexual activity: Yes    Birth control/protection: I.U.D.  Other Topics Concern   Not on file  Social History Narrative   Not on file   Social Determinants of Health   Financial Resource Strain: Not on file  Food Insecurity: Not on file  Transportation Needs: Not on file  Physical Activity: Not on file  Stress: Not on file  Social Connections: Not on file  Intimate Partner Violence: Not on file     Current Outpatient Medications:    atorvastatin (LIPITOR) 10 MG tablet, Take 1 tablet (10 mg total) by mouth daily., Disp: 90 tablet, Rfl: 3   glycopyrrolate (ROBINUL) 2 MG tablet, TAKE 1 TABLET(2 MG) BY MOUTH THREE TIMES DAILY, Disp: 90 tablet, Rfl: 3   levonorgestrel (MIRENA) 20 MCG/DAY IUD, 1 each by Intrauterine route once., Disp: , Rfl:    No Known Allergies  ROS Review of Systems  Constitutional: Negative.    HENT: Negative.    Eyes: Negative.   Respiratory: Negative.    Cardiovascular: Negative.   Gastrointestinal: Negative.   Endocrine: Negative.   Genitourinary: Negative.   Musculoskeletal: Negative.   Skin: Negative.   Allergic/Immunologic: Negative.   Neurological: Negative.   Hematological: Negative.   Psychiatric/Behavioral: Negative.    All other systems reviewed and are negative.     Objective:    Physical Exam Vitals reviewed.  Constitutional:      Appearance: Normal appearance.  HENT:     Mouth/Throat:     Mouth: Mucous membranes are moist.  Eyes:     Pupils: Pupils are equal, round, and reactive to light.  Neck:     Vascular: No carotid bruit.  Cardiovascular:     Rate and Rhythm: Normal rate and regular rhythm.     Pulses: Normal pulses.     Heart sounds: Normal heart sounds.  Pulmonary:     Effort: Pulmonary effort is normal.     Breath sounds: Normal breath sounds.  Abdominal:     General: Bowel sounds are normal.     Palpations: Abdomen is soft. There is no hepatomegaly, splenomegaly or mass.     Tenderness: There is no abdominal tenderness.     Hernia: No hernia is present.  Musculoskeletal:        General: No tenderness.  Cervical back: Neck supple.     Right lower leg: No edema.     Left lower leg: No edema.  Skin:    Findings: No rash.  Neurological:     Mental Status: She is alert and oriented to person, place, and time.     Motor: No weakness.  Psychiatric:        Mood and Affect: Mood and affect normal.        Behavior: Behavior normal.     BP 121/76   Pulse 88   Ht 5' (1.524 m)   Wt 168 lb 3.2 oz (76.3 kg)   BMI 32.85 kg/m  Wt Readings from Last 3 Encounters:  06/16/22 168 lb 3.2 oz (76.3 kg)  01/06/22 160 lb (72.6 kg)  12/01/21 164 lb (74.4 kg)     There are no preventive care reminders to display for this patient.  There are no preventive care reminders to display for this patient.  Lab Results  Component Value  Date   TSH 1.460 11/26/2021   Lab Results  Component Value Date   WBC 8.4 11/26/2021   HGB 15.1 11/26/2021   HCT 45.0 11/26/2021   MCV 91 11/26/2021   PLT 372 11/26/2021   No results found for: "NA", "K", "CHLORIDE", "CO2", "GLUCOSE", "BUN", "CREATININE", "BILITOT", "ALKPHOS", "AST", "ALT", "PROT", "ALBUMIN", "CALCIUM", "ANIONGAP", "EGFR", "GFR" Lab Results  Component Value Date   CHOL 194 11/26/2021   Lab Results  Component Value Date   HDL 32 (L) 11/26/2021   Lab Results  Component Value Date   LDLCALC 145 (H) 11/26/2021   Lab Results  Component Value Date   TRIG 90 11/26/2021   Lab Results  Component Value Date   CHOLHDL 6.1 (H) 11/26/2021   No results found for: "HGBA1C"    Assessment & Plan:   Problem List Items Addressed This Visit       Respiratory   Seasonal allergic rhinitis due to pollen - Primary    Take Claritin 10 mg p.o. daily        Other   Diverticulitis of colon (without mention of hemorrhage)(562.11)    Stable at the present time      Dyslipidemia    We will start on lipitor      Relevant Medications   atorvastatin (LIPITOR) 10 MG tablet   Smoker    - I instructed the patient to stop smoking and provided them with smoking cessation materials.  - I informed the patient that smoking puts them at increased risk for cancer, COPD, hypertension, and more.  - Informed the patient to seek help if they begin to have trouble breathing, develop chest pain, start to cough up blood, feel faint, or pass out.      Obesity (BMI 30-39.9)    - I encouraged the patient to lose weight.  - I educated them on making healthy dietary choices including eating more fruits and vegetables and less fried foods. - I encouraged the patient to exercise more, and educated on the benefits of exercise including weight loss, diabetes prevention, and hypertension prevention.   Dietary counseling with a registered dietician  Referral to a weight management support  group (e.g. Weight Watchers, Overeaters Anonymous)  If your BMI is greater than 29 or you have gained more than 15 pounds you should work on weight loss.  Attend a healthy cooking class        Meds ordered this encounter  Medications   atorvastatin (LIPITOR) 10 MG tablet  Sig: Take 1 tablet (10 mg total) by mouth daily.    Dispense:  90 tablet    Refill:  3    Follow-up: No follow-ups on file.    Cletis Athens, MD

## 2022-06-16 NOTE — Assessment & Plan Note (Signed)
We will start on lipitor

## 2022-06-16 NOTE — Assessment & Plan Note (Signed)
Stable at the present time. 

## 2022-06-16 NOTE — Assessment & Plan Note (Signed)
Take Claritin 10 mg p.o. daily 

## 2022-06-16 NOTE — Assessment & Plan Note (Signed)
-   I instructed the patient to stop smoking and provided them with smoking cessation materials.  - I informed the patient that smoking puts them at increased risk for cancer, COPD, hypertension, and more.  - Informed the patient to seek help if they begin to have trouble breathing, develop chest pain, start to cough up blood, feel faint, or pass out.  

## 2022-06-16 NOTE — Assessment & Plan Note (Signed)

## 2022-07-06 ENCOUNTER — Other Ambulatory Visit: Payer: Self-pay | Admitting: *Deleted

## 2022-07-06 MED ORDER — AMOXICILLIN-POT CLAVULANATE 875-125 MG PO TABS
1.0000 | ORAL_TABLET | Freq: Three times a day (TID) | ORAL | 0 refills | Status: DC
Start: 2022-07-06 — End: 2024-01-13

## 2022-09-22 ENCOUNTER — Ambulatory Visit: Payer: BLUE CROSS/BLUE SHIELD | Admitting: Internal Medicine

## 2022-11-18 IMAGING — CR DG KNEE COMPLETE 4+V*R*
1 series · 4 of 4 positions shown · non-contrast
Comparison: No pertinent prior exams available for comparison.

CLINICAL DATA: Right shoulder pain. Additional history provided:
Patient reports fall hitting right shoulder and twisting right knee.
Patient reports pain to anterior right knee and posterior right
shoulder.

EXAM:
RIGHT KNEE - COMPLETE 4+ VIEW

[Series 1: dg knee complete 4 views right · 0.14mm/px · 4 of 4 slices shown]
[im 1/4]
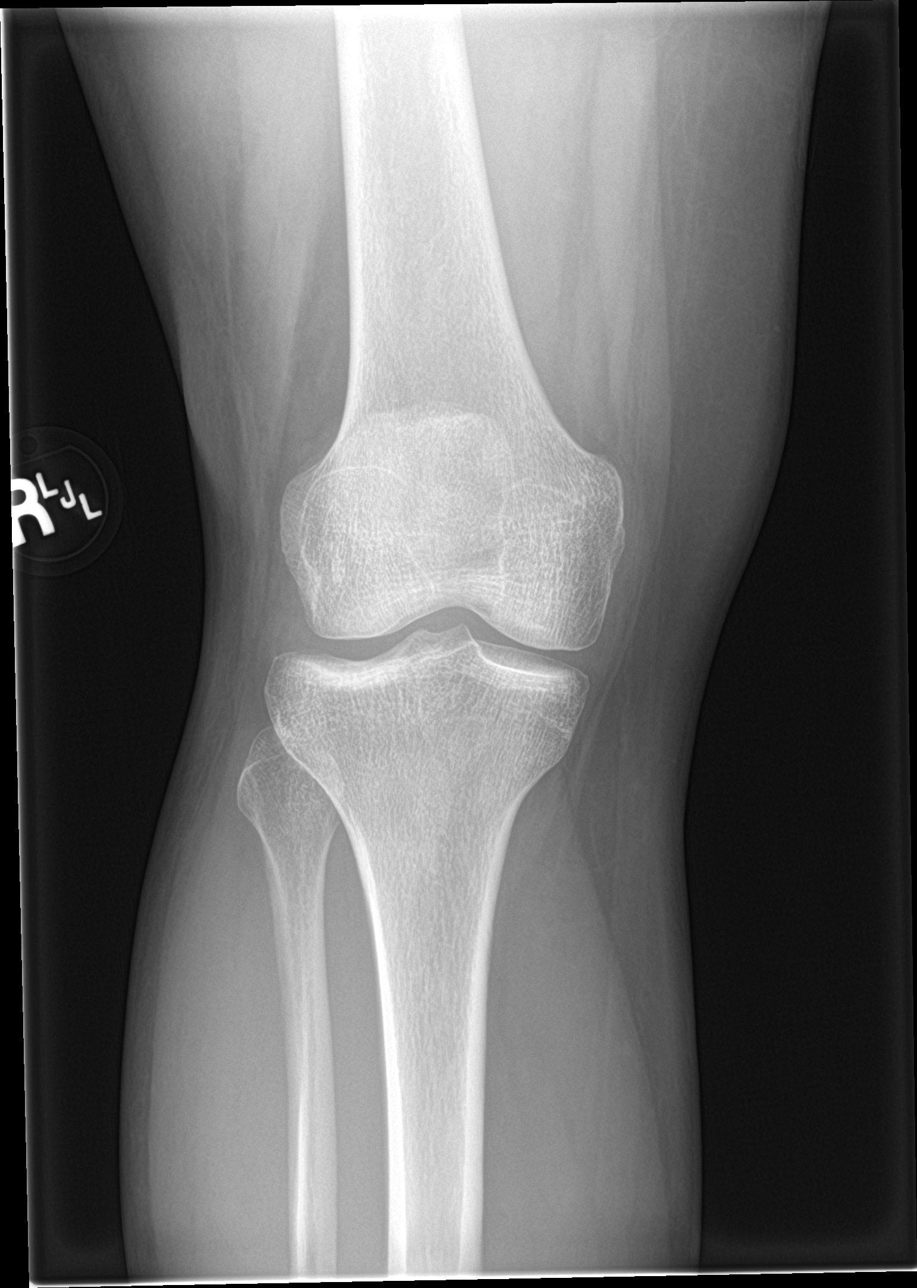
[im 2/4]
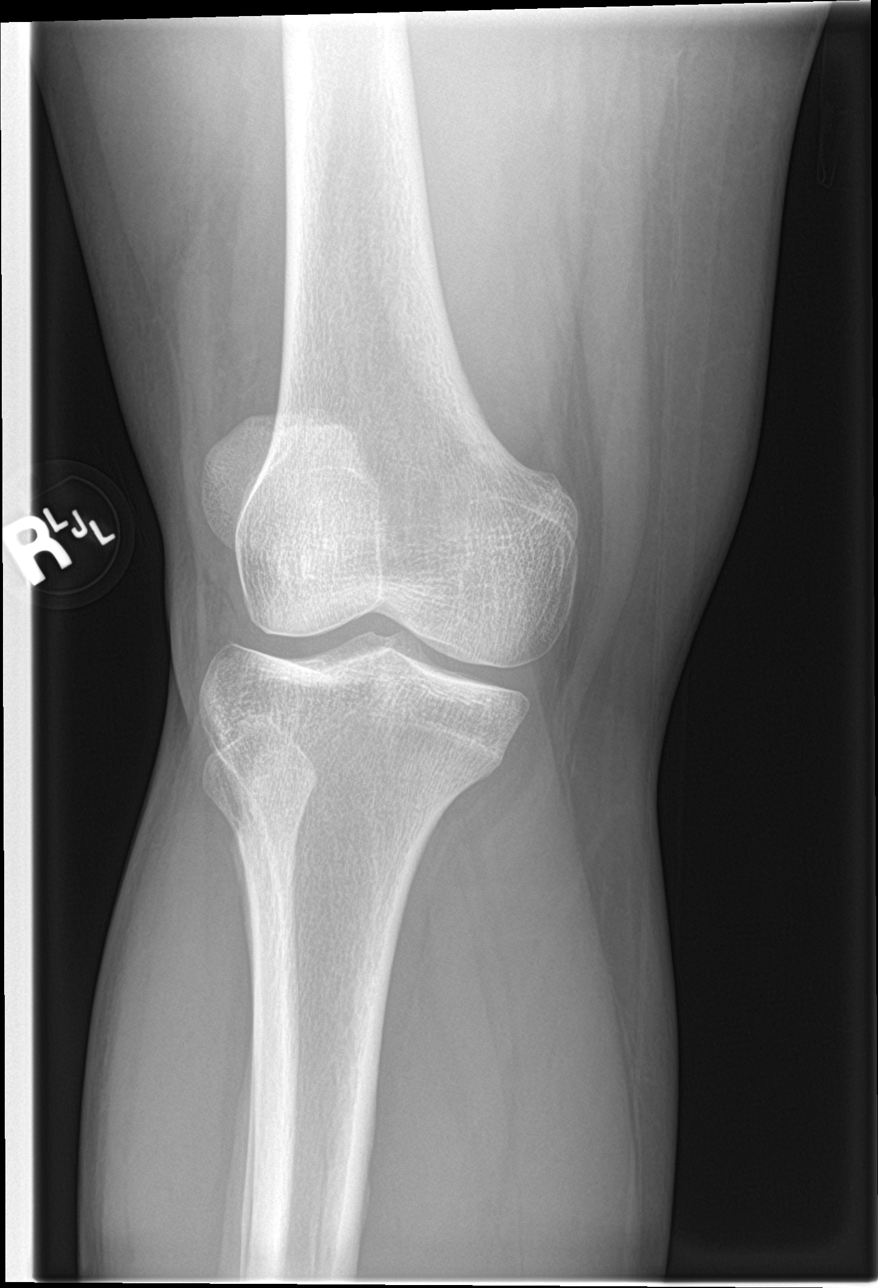
[im 3/4]
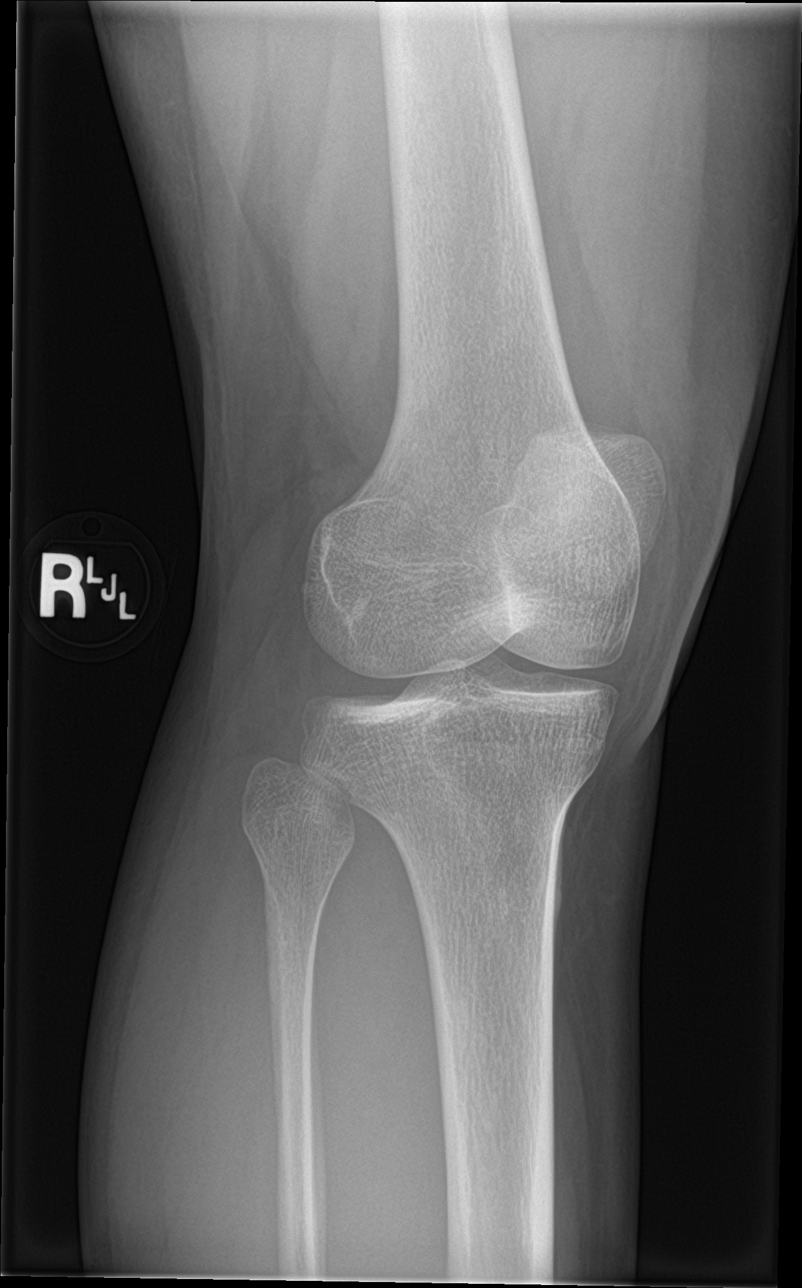
[im 4/4]
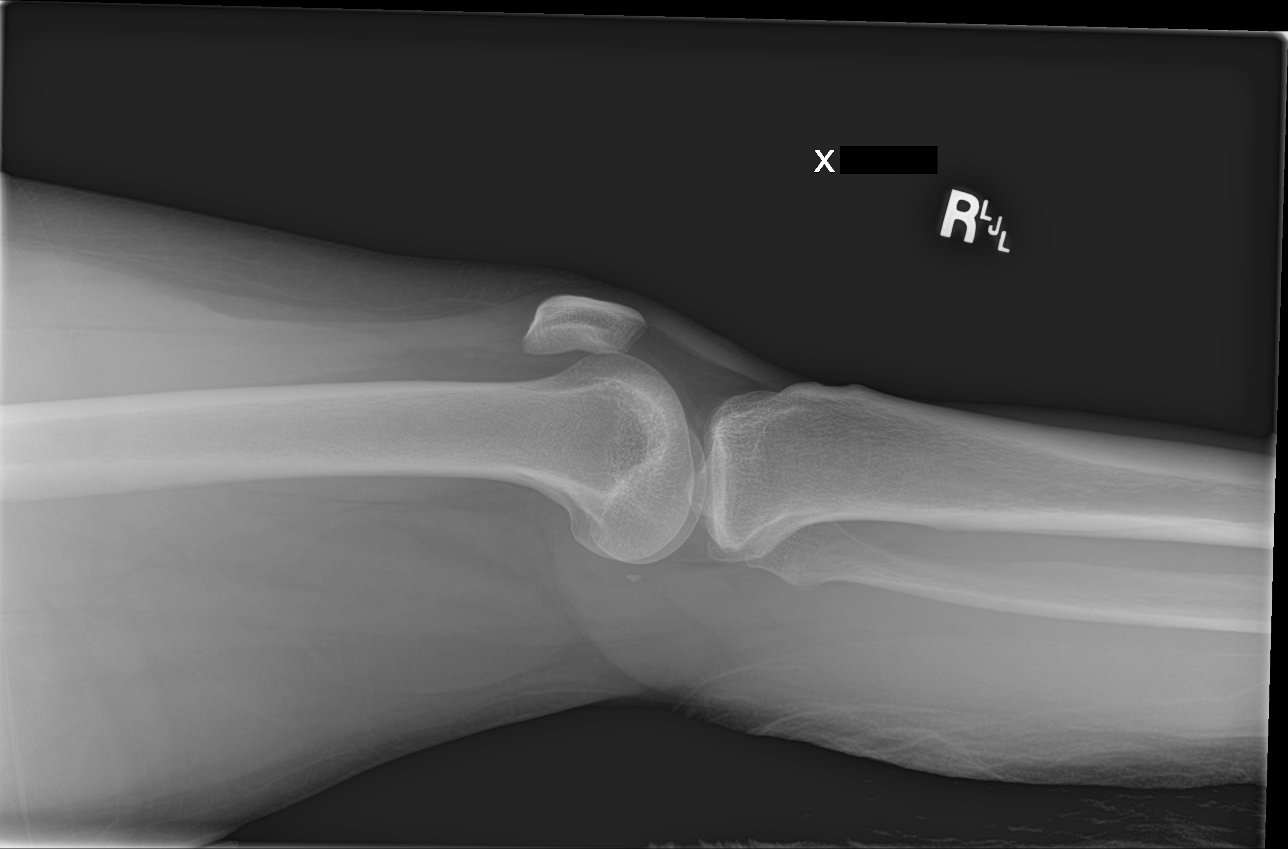

[4 of 4 positions shown; findings below may reference images not displayed]

FINDINGS: There is normal bony alignment.

No evidence of acute osseous or articular abnormality.

The joint spaces are maintained.
IMPRESSION: No evidence of acute osseous or articular abnormality.

## 2023-04-27 DIAGNOSIS — M19079 Primary osteoarthritis, unspecified ankle and foot: Secondary | ICD-10-CM | POA: Diagnosis not present

## 2023-05-12 DIAGNOSIS — M778 Other enthesopathies, not elsewhere classified: Secondary | ICD-10-CM | POA: Diagnosis not present

## 2023-05-12 DIAGNOSIS — M2011 Hallux valgus (acquired), right foot: Secondary | ICD-10-CM | POA: Diagnosis not present

## 2023-05-12 DIAGNOSIS — M79671 Pain in right foot: Secondary | ICD-10-CM | POA: Diagnosis not present

## 2023-05-12 DIAGNOSIS — M79672 Pain in left foot: Secondary | ICD-10-CM | POA: Diagnosis not present

## 2023-06-14 DIAGNOSIS — K5792 Diverticulitis of intestine, part unspecified, without perforation or abscess without bleeding: Secondary | ICD-10-CM | POA: Diagnosis not present

## 2023-09-22 DIAGNOSIS — R070 Pain in throat: Secondary | ICD-10-CM | POA: Diagnosis not present

## 2023-09-22 DIAGNOSIS — J039 Acute tonsillitis, unspecified: Secondary | ICD-10-CM | POA: Diagnosis not present

## 2023-12-23 DIAGNOSIS — Z760 Encounter for issue of repeat prescription: Secondary | ICD-10-CM | POA: Diagnosis not present

## 2023-12-23 DIAGNOSIS — K5792 Diverticulitis of intestine, part unspecified, without perforation or abscess without bleeding: Secondary | ICD-10-CM | POA: Diagnosis not present

## 2023-12-23 DIAGNOSIS — Z87898 Personal history of other specified conditions: Secondary | ICD-10-CM | POA: Diagnosis not present

## 2023-12-27 DIAGNOSIS — L02416 Cutaneous abscess of left lower limb: Secondary | ICD-10-CM | POA: Diagnosis not present

## 2023-12-29 DIAGNOSIS — L02419 Cutaneous abscess of limb, unspecified: Secondary | ICD-10-CM | POA: Diagnosis not present

## 2024-01-13 ENCOUNTER — Encounter: Payer: Self-pay | Admitting: Family Medicine

## 2024-01-13 ENCOUNTER — Ambulatory Visit: Admitting: Family Medicine

## 2024-01-13 VITALS — BP 105/60 | HR 84 | Ht 60.0 in | Wt 162.7 lb

## 2024-01-13 DIAGNOSIS — K5790 Diverticulosis of intestine, part unspecified, without perforation or abscess without bleeding: Secondary | ICD-10-CM | POA: Diagnosis not present

## 2024-01-13 DIAGNOSIS — Z720 Tobacco use: Secondary | ICD-10-CM

## 2024-01-13 DIAGNOSIS — R61 Generalized hyperhidrosis: Secondary | ICD-10-CM | POA: Diagnosis not present

## 2024-01-13 DIAGNOSIS — M9901 Segmental and somatic dysfunction of cervical region: Secondary | ICD-10-CM | POA: Diagnosis not present

## 2024-01-13 DIAGNOSIS — R0683 Snoring: Secondary | ICD-10-CM | POA: Insufficient documentation

## 2024-01-13 DIAGNOSIS — M546 Pain in thoracic spine: Secondary | ICD-10-CM | POA: Diagnosis not present

## 2024-01-13 DIAGNOSIS — M5412 Radiculopathy, cervical region: Secondary | ICD-10-CM | POA: Diagnosis not present

## 2024-01-13 DIAGNOSIS — E785 Hyperlipidemia, unspecified: Secondary | ICD-10-CM | POA: Diagnosis not present

## 2024-01-13 DIAGNOSIS — M9902 Segmental and somatic dysfunction of thoracic region: Secondary | ICD-10-CM | POA: Diagnosis not present

## 2024-01-13 DIAGNOSIS — M1990 Unspecified osteoarthritis, unspecified site: Secondary | ICD-10-CM | POA: Insufficient documentation

## 2024-01-13 MED ORDER — GLYCOPYRROLATE 2 MG PO TABS: 2.0000 mg | ORAL_TABLET | Freq: Three times a day (TID) | ORAL | 3 refills | Status: AC

## 2024-01-13 NOTE — Assessment & Plan Note (Signed)
 Chronic  Long-term tobacco use, currently smoking about half a pack per day. Previously quit for about three months using Chantix but resumed due to stress and husband's continued smoking. Expressed interest in quitting again. - Support smoking cessation efforts and offer assistance if she decides to quit again. - counseled on smoking cessation for 10 minutes, patient is in contemplative stage, previously used Chantix but would like to get her husband on board for cessation prior to another attempt

## 2024-01-13 NOTE — Assessment & Plan Note (Signed)
 Chronic Elevated cholesterol levels  Patient has stopped taking statin therapy

## 2024-01-13 NOTE — Assessment & Plan Note (Signed)
 Chronic, no current symptoms  Intermittent flares of diverticulitis, typically managed with antibiotics at urgent care. No current flare reported. - continue to manage if there are any flares

## 2024-01-13 NOTE — Progress Notes (Signed)
 New patient visit   Patient: Allison Navarro   DOB: 1986/04/18   38 y.o. Female  MRN: [id number] Visit Date: 01/13/2024  Today's healthcare provider: Mimi Alt, MD   Chief Complaint  Patient presents with   Establish Care    Establishing care, no concerns   Subjective    Allison Navarro is a 38 y.o. female who presents today as a new patient to establish care.   HPI     Establish Care    Additional comments: Establishing care, no concerns      Last edited by Gennaro Khat on 01/13/2024  3:16 PM.       Discussed the use of AI scribe software for clinical note transcription with the patient, who gave verbal consent to proceed.  History of Present Illness Allison Navarro is a 38 year old female who presents to establish care and refill her glycopyrrolate  prescription for hyperhidrosis.  She is currently taking atorvastatin  2 mg daily, glycopyrrolate  2 mg three times daily, and has a Mirena IUD in place. She uses glycopyrrolate  for hyperhidrosis.  She has arthritis in her back and has been receiving massages for the past two to three years. Recently, she started seeing a chiropractor as the massages were no longer effective. She did not mention any specific medications for arthritis.  She experiences occasional flares of diverticulitis, for which she has been treated with antibiotics at urgent care facilities. No current symptoms or recent flares were reported.  She has been prescribed atorvastatin  for cholesterol management but has not been taking it due to her preference to avoid medication unless absolutely necessary. She is unsure of her current cholesterol levels.  She has been smoking since she was [age] and currently smokes about half a pack a day. She previously quit smoking for about three months using Chantix but resumed due to stress and her husband's continued smoking. She is open to the idea of quitting again in the future.  She has started  snoring over the past couple of years and her sister suggested she inquire about sleep apnea. She does not believe she has any breathing problems during sleep, but her husband notices her snoring. She is constantly tired, which she attributes to her busy schedule.      Past Medical History:  Diagnosis Date   Arthritis    lower back   Diverticulitis     Outpatient Medications Prior to Visit  Medication Sig   levonorgestrel (MIRENA) 20 MCG/DAY IUD 1 each by Intrauterine route once.   [DISCONTINUED] amoxicillin -clavulanate (AUGMENTIN ) 875-125 MG tablet Take 1 tablet by mouth 3 (three) times daily with meals.   [DISCONTINUED] atorvastatin  (LIPITOR) 10 MG tablet Take 1 tablet (10 mg total) by mouth daily.   [DISCONTINUED] glycopyrrolate  (ROBINUL ) 2 MG tablet TAKE 1 TABLET(2 MG) BY MOUTH THREE TIMES DAILY   No facility-administered medications prior to visit.    Past Surgical History:  Procedure Laterality Date   COLONOSCOPY WITH PROPOFOL  N/A 01/06/2022   Procedure: COLONOSCOPY WITH PROPOFOL ;  Surgeon: Marnee Sink, MD;  Location: [facility] ENDOSCOPY;  Service: Endoscopy;  Laterality: N/A;   NO PAST SURGERIES     Family Status  Relation Name Status   Mother  Alive   Father  Deceased  No partnership data on file   History reviewed. No pertinent family history. Social History   Socioeconomic History   Marital status: Married    Spouse name: steven Dominski   Number of children: 2   Years of education: Not  on file   Highest education level: GED or equivalent  Occupational History   Not on file  Tobacco Use   Smoking status: Every Day    Current packs/day: 0.50    Average packs/day: 0.5 packs/day for 26.0 years (13.0 ttl pk-yrs)    Types: Cigarettes    Start date: 01/12/1998   Smokeless tobacco: Never   Tobacco comments:    Been smoking since 38 years old  Vaping Use   Vaping status: Never Used  Substance and Sexual Activity   Alcohol use: Not Currently   Drug use: Never    Sexual activity: Yes    Birth control/protection: I.U.D.  Other Topics Concern   Not on file  Social History Narrative   Not on file   Social Drivers of Health   Financial Resource Strain: Low Risk  (01/13/2024)   Overall Financial Resource Strain (CARDIA)    Difficulty of Paying Living Expenses: Not hard at all  Food Insecurity: No Food Insecurity (01/13/2024)   Hunger Vital Sign    Worried About Running Out of Food in the Last Year: Never true    Ran Out of Food in the Last Year: Never true  Transportation Needs: No Transportation Needs (01/13/2024)   PRAPARE - Administrator, Civil Service (Medical): No    Lack of Transportation (Non-Medical): No  Physical Activity: Sufficiently Active (01/13/2024)   Exercise Vital Sign    Days of Exercise per Week: 7 days    Minutes of Exercise per Session: 30 min  Stress: No Stress Concern Present (01/13/2024)   [employer] of Occupational Health - Occupational Stress Questionnaire    Feeling of Stress : Not at all  Social Connections: Not on file     No Known Allergies   There is no immunization history on file for this patient.  Health Maintenance  Topic Date Due   HIV Screening  Never done   Hepatitis C Screening  Never done   DTaP/Tdap/Td (1 - Tdap) Never done   Pneumococcal Vaccine 45-43 Years old (1 of 2 - PCV) Never done   COVID-19 Vaccine (1 - 2024-25 season) Never done   INFLUENZA VACCINE  04/14/2024   Cervical Cancer Screening (HPV/Pap Cotest)  11/27/2026   HPV VACCINES  Aged Out   Meningococcal B Vaccine  Aged Out    Patient Care Team: Mimi Alt, MD as PCP - General (Family Medicine)  Review of Systems  Last CBC Lab Results  Component Value Date   WBC 8.4 11/26/2021   HGB 15.1 11/26/2021   HCT 45.0 11/26/2021   MCV 91 11/26/2021   MCH 30.6 11/26/2021   RDW 12.1 11/26/2021   PLT 372 11/26/2021   Last metabolic panel No results found for: "GLUCOSE", "NA", "K", "CL", "CO2", "BUN",  "CREATININE", "EGFR", "CALCIUM ", "PHOS", "PROT", "ALBUMIN", "LABGLOB", "AGRATIO", "BILITOT", "ALKPHOS", "AST", "ALT", "ANIONGAP" Last lipids Lab Results  Component Value Date   CHOL 194 11/26/2021   HDL 32 (L) 11/26/2021   LDLCALC 145 (H) 11/26/2021   TRIG 90 11/26/2021   CHOLHDL 6.1 (H) 11/26/2021  The ASCVD Risk score (Arnett DK, et al., 2019) failed to calculate for the following reasons:   The 2019 ASCVD risk score is only valid for ages 34 to 27   Last hemoglobin A1c No results found for: "HGBA1C" Last thyroid functions Lab Results  Component Value Date   TSH 1.460 11/26/2021        Objective    BP 105/60 (BP Location: Right  Arm, Patient Position: Sitting, Cuff Size: Normal)   Pulse 84   Ht 5' (1.524 m)   Wt 162 lb 11.2 oz (73.8 kg)   SpO2 98%   BMI 31.78 kg/m  BP Readings from Last 3 Encounters:  01/13/24 105/60  06/16/22 121/76  01/06/22 110/82   Wt Readings from Last 3 Encounters:  01/13/24 162 lb 11.2 oz (73.8 kg)  06/16/22 168 lb 3.2 oz (76.3 kg)  01/06/22 160 lb (72.6 kg)        Depression Screen    01/13/2024    3:27 PM 10/08/2021    4:01 PM  PHQ 2/9 Scores  PHQ - 2 Score 0 0   No results found for any visits on 01/13/24.   Physical Exam General: Alert, no acute distress HEENT: MMM, no tonsillar exudate, tonsils 2+ bilaterally  Cardio: Normal S1 and S2, RRR, no r/m/g Pulm: CTAB, normal work of breathing ABD: soft, abdomen is not distended, there is no tenderness to palpation, normal BS  Extremities: no LE edema      Assessment & Plan      Problem List Items Addressed This Visit       Digestive   Diverticulosis   Chronic, no current symptoms  Intermittent flares of diverticulitis, typically managed with antibiotics at urgent care. No current flare reported. - continue to manage if there are any flares        Musculoskeletal and Integument   Hyperhidrosis   Chronic, stable  Hyperhidrosis managed with glycopyrrolate . She  requires a medication refill. - Prescribe glycopyrrolate  2 mg three times daily with a one-year supply.      Relevant Medications   glycopyrrolate  (ROBINUL ) 2 MG tablet   Arthritis - Primary   Back pain due to arthritis Chronic back pain attributed to arthritis, previously managed with massages, now transitioning to chiropractic treatment due to decreased efficacy of massages. - Continue chiropractic treatment for back pain management.        Other   Tobacco use   Chronic  Long-term tobacco use, currently smoking about half a pack per day. Previously quit for about three months using Chantix but resumed due to stress and husband's continued smoking. Expressed interest in quitting again. - Support smoking cessation efforts and offer assistance if she decides to quit again. - counseled on smoking cessation for 10 minutes, patient is in contemplative stage, previously used Chantix but would like to get her husband on board for cessation prior to another attempt       Snoring   Snoring  Interested in sleep study in the future  Denies red flag symptoms currently  Will revisit       Dyslipidemia   Chronic Elevated cholesterol levels  Patient has stopped taking statin therapy         Assessment & Plan     General Health Maintenance Discussed the importance of annual physical exams, including screening for cholesterol, diabetes, anemia, liver and kidney function, and electrolytes. Also discussed the need for routine screenings such as Pap smear, mammogram, and colon cancer screening based on age and family history. - Schedule a physical exam in six months.     Return in about 6 months (around 07/15/2024) for CPE.      Mimi Alt, MD  [facility] [phone] (phone) [phone] (fax)  [facility]

## 2024-01-13 NOTE — Assessment & Plan Note (Signed)
 Snoring  Interested in sleep study in the future  Denies red flag symptoms currently  Will revisit

## 2024-01-13 NOTE — Assessment & Plan Note (Signed)
 Back pain due to arthritis Chronic back pain attributed to arthritis, previously managed with massages, now transitioning to chiropractic treatment due to decreased efficacy of massages. - Continue chiropractic treatment for back pain management.

## 2024-01-13 NOTE — Assessment & Plan Note (Signed)
 Chronic, stable  Hyperhidrosis managed with glycopyrrolate . She requires a medication refill. - Prescribe glycopyrrolate  2 mg three times daily with a one-year supply.

## 2024-01-18 DIAGNOSIS — M546 Pain in thoracic spine: Secondary | ICD-10-CM | POA: Diagnosis not present

## 2024-01-18 DIAGNOSIS — M9901 Segmental and somatic dysfunction of cervical region: Secondary | ICD-10-CM | POA: Diagnosis not present

## 2024-01-18 DIAGNOSIS — M9902 Segmental and somatic dysfunction of thoracic region: Secondary | ICD-10-CM | POA: Diagnosis not present

## 2024-01-18 DIAGNOSIS — M5412 Radiculopathy, cervical region: Secondary | ICD-10-CM | POA: Diagnosis not present

## 2024-02-14 ENCOUNTER — Other Ambulatory Visit: Payer: Self-pay | Admitting: Family Medicine

## 2024-02-14 DIAGNOSIS — Z720 Tobacco use: Secondary | ICD-10-CM

## 2024-02-14 MED ORDER — VARENICLINE TARTRATE (STARTER) 0.5 MG X 11 & 1 MG X 42 PO TBPK: ORAL_TABLET | ORAL | 0 refills | Status: DC

## 2024-02-14 NOTE — Progress Notes (Signed)
 Started on Chantix, pt requested during husband's appt

## 2024-02-16 DIAGNOSIS — N390 Urinary tract infection, site not specified: Secondary | ICD-10-CM | POA: Diagnosis not present

## 2024-02-16 DIAGNOSIS — R350 Frequency of micturition: Secondary | ICD-10-CM | POA: Diagnosis not present

## 2024-02-24 DIAGNOSIS — N390 Urinary tract infection, site not specified: Secondary | ICD-10-CM | POA: Diagnosis not present

## 2024-06-08 DIAGNOSIS — R110 Nausea: Secondary | ICD-10-CM | POA: Diagnosis not present

## 2024-06-08 DIAGNOSIS — R55 Syncope and collapse: Secondary | ICD-10-CM | POA: Diagnosis not present

## 2024-06-09 ENCOUNTER — Ambulatory Visit (INDEPENDENT_AMBULATORY_CARE_PROVIDER_SITE_OTHER): Admitting: Physician Assistant

## 2024-06-09 ENCOUNTER — Encounter: Payer: Self-pay | Admitting: Physician Assistant

## 2024-06-09 VITALS — BP 121/79 | HR 76 | Resp 14 | Ht 60.0 in | Wt 171.8 lb

## 2024-06-09 DIAGNOSIS — R42 Dizziness and giddiness: Secondary | ICD-10-CM | POA: Diagnosis not present

## 2024-06-09 DIAGNOSIS — E669 Obesity, unspecified: Secondary | ICD-10-CM

## 2024-06-09 DIAGNOSIS — R0683 Snoring: Secondary | ICD-10-CM | POA: Diagnosis not present

## 2024-06-09 DIAGNOSIS — Z720 Tobacco use: Secondary | ICD-10-CM

## 2024-06-09 DIAGNOSIS — R6889 Other general symptoms and signs: Secondary | ICD-10-CM

## 2024-06-09 NOTE — Progress Notes (Signed)
 Established patient visit  Patient: Allison Navarro   DOB: 1985-11-30   38 y.o. Female  MRN: [id number] Visit Date: 06/09/2024  Today's healthcare provider: Jolynn Spencer, PA-C   Chief Complaint  Patient presents with   Dizziness    Pt was inside the building doing work and after going outside had the episode of nausea, body felt hot, dizzy onset one time yesterday. Went to urgent care yesterday; was told had a vasovagal syncope. Wants a 2nd opinion may need blood work    Subjective     HPI     Dizziness    Additional comments: Pt was inside the building doing work and after going outside had the episode of nausea, body felt hot, dizzy onset one time yesterday. Went to urgent care yesterday; was told had a vasovagal syncope. Wants a 2nd opinion may need blood work       Last edited by Wilfred Hargis RAMAN, CMA on 06/09/2024  1:32 PM.       Discussed the use of AI scribe software for clinical note transcription with the patient, who gave verbal consent to proceed.  History of Present Illness Allison Navarro is a 38 year old female who presents with dizziness and associated symptoms.  Dizziness began yesterday while walking and was positional, improving with rest and hydration. Persistent symptoms include nausea and a sensation of skin warmth without actual heat exposure. She disagrees with the urgent care assessment of stress and heat as causes. She seeks blood work to evaluate electrolytes and thyroid function.  No shortness of breath, chest pain, palpitations, syncope, visual disturbances, weakness, slurred speech, or headache. Occasional tingling in feet and hands when sitting or resting. No recent blood work to assess blood sugar levels.  She smokes a pack a day and previously attempted smoking cessation with Chantix . She works long hours and attributes fatigue to her schedule. She avoids eating during the day to prevent stomach upset and drinks water regularly. She experiences  snoring but denies anxiety requiring treatment.     01/13/2024    3:27 PM 10/08/2021    4:01 PM  PHQ9 SCORE ONLY  PHQ-9 Total Score 0  0      Data saved with a previous flowsheet row definition        01/13/2024    3:27 PM 10/08/2021    4:01 PM  Depression screen PHQ 2/9  Decreased Interest 0 0  Down, Depressed, Hopeless 0 0  PHQ - 2 Score 0 0      01/13/2024    3:27 PM  GAD 7 : Generalized Anxiety Score  Nervous, Anxious, on Edge 0  Control/stop worrying 0  Worry too much - different things 0  Trouble relaxing 0  Easily annoyed or irritable 1  Afraid - awful might happen 0  Anxiety Difficulty Not difficult at all    Medications: Outpatient Medications Prior to Visit  Medication Sig   glycopyrrolate  (ROBINUL ) 2 MG tablet Take 1 tablet (2 mg total) by mouth 3 (three) times daily.   levonorgestrel (MIRENA) 20 MCG/DAY IUD 1 each by Intrauterine route once.   Varenicline  Tartrate, Starter, (CHANTIX  STARTING MONTH PAK) 0.5 MG X 11 & 1 MG X 42 TBPK Days 1 to 3: Take 0.5 mg by mouth once daily. Days 4 to 7: Take 0.5 mg twice daily. Day 8 (and later): 1 mg twice daily   No facility-administered medications prior to visit.    Review of Systems All negative Except see HPI  Objective    BP 121/79   Pulse 76   Resp 14   Ht 5' (1.524 m)   Wt 171 lb 12.8 oz (77.9 kg)   SpO2 98%   BMI 33.55 kg/m     Physical Exam Vitals reviewed.  Constitutional:      General: She is not in acute distress.    Appearance: Normal appearance. She is well-developed. She is not diaphoretic.  HENT:     Head: Normocephalic and atraumatic.     Mouth/Throat:     Comments: Enlarged right tonsil Eyes:     General: No scleral icterus.    Conjunctiva/sclera: Conjunctivae normal.  Neck:     Thyroid: No thyromegaly.  Cardiovascular:     Rate and Rhythm: Normal rate and regular rhythm.     Pulses: Normal pulses.     Heart sounds: Normal heart sounds. No murmur heard. Pulmonary:      Effort: Pulmonary effort is normal. No respiratory distress.     Breath sounds: Normal breath sounds. No wheezing, rhonchi or rales.  Musculoskeletal:     Cervical back: Neck supple.     Right lower leg: No edema.     Left lower leg: No edema.  Lymphadenopathy:     Cervical: No cervical adenopathy.  Skin:    General: Skin is warm and dry.     Findings: No rash.  Neurological:     Mental Status: She is alert and oriented to person, place, and time. Mental status is at baseline.  Psychiatric:        Mood and Affect: Mood normal.        Behavior: Behavior normal.      No results found for any visits on 06/09/24.   Assessment & Plan Dizziness and nausea/sensation of feeling hot Experienced positional dizziness with hot skin sensation and persistent nausea. Differential includes electrolyte imbalance, thyroid dysfunction, or metabolic issues. - Order blood work for electrolytes, thyroid function, and diabetes markers. - Consider EKG if symptoms persist or worsen.  Snoring  enlarged right tonsil Enlarged right tonsil potentially contributing to snoring.  Suspected obstructive sleep apnea Suspected due to snoring and crowded pharynx. Hesitant about CPAP, open to nasal cannula. - Refer to sleep specialist for sleep study.  Tobacco use disorder Smokes half a pack per day. Chantix  ineffective. Discussed alternative cessation options. - Recommend contacting National Quit Smoking Line for alternative cessation medications and support.  Obesity (BMI 30-39.9) (Primary) Chronic and stable Body mass index is 33.55 kg/m. Weight loss of 5% of pt's current weight via healthy diet and daily exercise encouraged. Will work up - Hemoglobin A1c - CBC with Differential/Platelet - Comprehensive metabolic panel with GFR - TSH - Lipid panel And will follow-up with her primary   Orders Placed This Encounter  Procedures   Hemoglobin A1c   CBC with Differential/Platelet   Comprehensive  metabolic panel with GFR    Has the patient fasted?:   Yes   TSH   Lipid panel    Has the patient fasted?:   Yes    No follow-ups on file.   The patient was advised to call back or seek an in-person evaluation if the symptoms worsen or if the condition fails to improve as anticipated.  I discussed the assessment and treatment plan with the patient. The patient was provided an opportunity to ask questions and all were answered. The patient agreed with the plan and demonstrated an understanding of the instructions.  I, Malic Rosten, PA-C have  reviewed all documentation for this visit. The documentation on 06/09/2024  for the exam, diagnosis, procedures, and orders are all accurate and complete.  Jolynn Spencer, [facility], MMS [facility] [phone] (phone) [phone] (fax)  [facility]

## 2024-06-10 LAB — HEMOGLOBIN A1C
Est. average glucose Bld gHb Est-mCnc: 131 mg/dL
Hgb A1c MFr Bld: 6.2 % — ABNORMAL HIGH (ref 4.8–5.6)

## 2024-06-10 LAB — CBC WITH DIFFERENTIAL/PLATELET
Basophils Absolute: 0 x10E3/uL (ref 0.0–0.2)
Basos: 0 %
EOS (ABSOLUTE): 0.2 x10E3/uL (ref 0.0–0.4)
Eos: 2 %
Hematocrit: 49.2 % — ABNORMAL HIGH (ref 34.0–46.6)
Hemoglobin: 16.3 g/dL — ABNORMAL HIGH (ref 11.1–15.9)
Immature Grans (Abs): 0 x10E3/uL (ref 0.0–0.1)
Immature Granulocytes: 0 %
Lymphocytes Absolute: 3.6 x10E3/uL — ABNORMAL HIGH (ref 0.7–3.1)
Lymphs: 36 %
MCH: 31.7 pg (ref 26.6–33.0)
MCHC: 33.1 g/dL (ref 31.5–35.7)
MCV: 96 fL (ref 79–97)
Monocytes Absolute: 0.8 x10E3/uL (ref 0.1–0.9)
Monocytes: 8 %
Neutrophils Absolute: 5.6 x10E3/uL (ref 1.4–7.0)
Neutrophils: 54 %
Platelets: 375 x10E3/uL (ref 150–450)
RBC: 5.14 x10E6/uL (ref 3.77–5.28)
RDW: 12 % (ref 11.7–15.4)
WBC: 10.3 x10E3/uL (ref 3.4–10.8)

## 2024-06-10 LAB — COMPREHENSIVE METABOLIC PANEL WITH GFR
ALT: 47 IU/L — ABNORMAL HIGH (ref 0–32)
AST: 34 IU/L (ref 0–40)
Albumin: 4.5 g/dL (ref 3.9–4.9)
Alkaline Phosphatase: 86 IU/L (ref 41–116)
BUN/Creatinine Ratio: 14 (ref 9–23)
BUN: 12 mg/dL (ref 6–20)
Bilirubin Total: 0.2 mg/dL (ref 0.0–1.2)
CO2: 20 mmol/L (ref 20–29)
Calcium: 9.9 mg/dL (ref 8.7–10.2)
Chloride: 100 mmol/L (ref 96–106)
Creatinine, Ser: 0.88 mg/dL (ref 0.57–1.00)
Globulin, Total: 2.6 g/dL (ref 1.5–4.5)
Glucose: 116 mg/dL — ABNORMAL HIGH (ref 70–99)
Potassium: 5.5 mmol/L — ABNORMAL HIGH (ref 3.5–5.2)
Sodium: 136 mmol/L (ref 134–144)
Total Protein: 7.1 g/dL (ref 6.0–8.5)
eGFR: 86 mL/min/1.73 (ref >59)

## 2024-06-10 LAB — LIPID PANEL
Chol/HDL Ratio: 8.1 ratio — ABNORMAL HIGH (ref 0.0–4.4)
Cholesterol, Total: 242 mg/dL — ABNORMAL HIGH (ref 100–199)
HDL: 30 mg/dL — ABNORMAL LOW (ref >39)
LDL Chol Calc (NIH): 185 mg/dL — ABNORMAL HIGH (ref 0–99)
Triglycerides: 145 mg/dL (ref 0–149)
VLDL Cholesterol Cal: 27 mg/dL (ref 5–40)

## 2024-06-10 LAB — TSH: TSH: 1.7 u[IU]/mL (ref 0.450–4.500)

## 2024-06-12 ENCOUNTER — Ambulatory Visit: Payer: Self-pay | Admitting: Physician Assistant

## 2024-06-12 NOTE — Progress Notes (Signed)
 Please check with [facility] if we can repeat potassium for elevated potassium

## 2024-07-12 ENCOUNTER — Encounter: Payer: Self-pay | Admitting: Sleep Medicine

## 2024-07-12 ENCOUNTER — Ambulatory Visit: Admitting: Sleep Medicine

## 2024-07-12 VITALS — BP 110/80 | HR 65 | Temp 98.1°F | Ht 60.0 in | Wt 174.0 lb

## 2024-07-12 DIAGNOSIS — G4733 Obstructive sleep apnea (adult) (pediatric): Secondary | ICD-10-CM

## 2024-07-12 DIAGNOSIS — Z6833 Body mass index (BMI) 33.0-33.9, adult: Secondary | ICD-10-CM | POA: Diagnosis not present

## 2024-07-12 DIAGNOSIS — E669 Obesity, unspecified: Secondary | ICD-10-CM | POA: Diagnosis not present

## 2024-07-12 NOTE — Progress Notes (Signed)
 Name:Allison Navarro MRN: [id number] DOB: 10/26/1985   CHIEF COMPLAINT:  EXCESSIVE DAYTIME SLEEPINESS   HISTORY OF PRESENT ILLNESS: Allison Navarro is a 38 y.o. w/ a h/o obesity who present for c/o loud snoring and excessive daytime sleepiness which has been present for several years. Denies any nocturnal awakenings. Reports significant weight changes. Denies morning headaches, RLS symptoms, dream enactment, cataplexy, hypnagogic or hypnapompic hallucinations. Reports a family history of sleep apnea. Denies drowsy driving. Drinks 1 cup of coffee and 2 sodas daily, occasional alcohol use, smokes 1/2 ppd tobacco use, denies illicit drug use.   Bedtime 9-10 pm Sleep onset 5 mins Rise time 6-7 am   EPWORTH SLEEP SCORE 13    07/12/2024    8:00 AM  Results of the Epworth flowsheet  Sitting and reading 3  Watching TV 3  Sitting, inactive in a public place (e.g. a theatre or a meeting) 1  As a passenger in a car for an hour without a break 3  Lying down to rest in the afternoon when circumstances permit 3  Sitting and talking to someone 0  Sitting quietly after a lunch without alcohol 0  In a car, while stopped for a few minutes in traffic 0  Total score 13    PAST MEDICAL HISTORY :   has a past medical history of Arthritis and Diverticulitis.  has a past surgical history that includes No past surgeries and Colonoscopy with propofol  (N/A, 01/06/2022). Prior to Admission medications   Medication Sig Start Date End Date Taking? Authorizing Provider  glycopyrrolate  (ROBINUL ) 2 MG tablet Take 1 tablet (2 mg total) by mouth 3 (three) times daily. 01/13/24  Yes Simmons-Robinson, Makiera, MD  levonorgestrel (MIRENA) 20 MCG/DAY IUD 1 each by Intrauterine route once.   Yes [provider]  Varenicline  Tartrate, Starter, (CHANTIX  STARTING MONTH PAK) 0.5 MG X 11 & 1 MG X 42 TBPK Days 1 to 3: Take 0.5 mg by mouth once daily. Days 4 to 7: Take 0.5 mg twice daily. Day 8 (and later): 1 mg  twice daily 02/14/24  Yes Simmons-Robinson, Makiera, MD   No Known Allergies  FAMILY HISTORY:  family history is not on file. SOCIAL HISTORY:  reports that she has been smoking cigarettes. She started smoking about 26 years ago. She has a 13.2 pack-year smoking history. She has never used smokeless tobacco. She reports that she does not currently use alcohol. She reports that she does not use drugs.   Review of Systems:  Gen:  Denies  fever, sweats, chills weight loss  HEENT: Denies blurred vision, double vision, ear pain, eye pain, hearing loss, nose bleeds, sore throat Cardiac:  No dizziness, chest pain or heaviness, chest tightness,edema, No JVD Resp:   No cough, -sputum production, -shortness of breath,-wheezing, -hemoptysis,  Gi: Denies swallowing difficulty, stomach pain, nausea or vomiting, diarrhea, constipation, bowel incontinence Gu:  Denies bladder incontinence, burning urine Ext:   Denies Joint pain, stiffness or swelling Skin: Denies  skin rash, easy bruising or bleeding or hives Endoc:  Denies polyuria, polydipsia , polyphagia or weight change Psych:   Denies depression, insomnia or hallucinations  Other:  All other systems negative  VITAL SIGNS: BP 110/80   Pulse 65   Temp 98.1 F (36.7 C)   Ht 5' (1.524 m)   Wt 174 lb (78.9 kg)   LMP  (LMP Unknown)   SpO2 95%   BMI 33.98 kg/m     Physical Examination:   General  Appearance: No distress  EYES PERRLA, EOM intact.   NECK Supple, No JVD Pulmonary: normal breath sounds, No wheezing.  CardiovascularNormal S1,S2.  No m/r/g.   Abdomen: Benign, Soft, non-tender. Skin:   warm, no rashes, no ecchymosis  Extremities: normal, no cyanosis, clubbing. Neuro:without focal findings,  speech normal  PSYCHIATRIC: Mood, affect within normal limits.   ASSESSMENT AND PLAN  OSA I suspect that OSA is likely present due to clinical presentation. Discussed the consequences of untreated sleep apnea. Advised not to drive drowsy  for safety of patient and others. Will complete further evaluation with a home sleep study and follow up to review results.    Obesity Counseled patient on diet and lifestyle modification.    Patient  satisfied with Plan of action and management. All questions answered  I spent a total of 47 minutes reviewing chart data, face-to-face evaluation with the patient, counseling and coordination of care as detailed above.    Allison Navarro, M.D.  Sleep Medicine [facility]

## 2024-07-12 NOTE — Patient Instructions (Addendum)
 SABRA

## 2024-07-18 ENCOUNTER — Encounter: Payer: Self-pay | Admitting: Family Medicine

## 2024-07-18 ENCOUNTER — Ambulatory Visit (INDEPENDENT_AMBULATORY_CARE_PROVIDER_SITE_OTHER): Admitting: Family Medicine

## 2024-07-18 VITALS — BP 122/77 | HR 66 | Temp 98.6°F | Ht 60.0 in | Wt 173.0 lb

## 2024-07-18 DIAGNOSIS — R7303 Prediabetes: Secondary | ICD-10-CM

## 2024-07-18 DIAGNOSIS — E875 Hyperkalemia: Secondary | ICD-10-CM | POA: Diagnosis not present

## 2024-07-18 DIAGNOSIS — E785 Hyperlipidemia, unspecified: Secondary | ICD-10-CM

## 2024-07-18 DIAGNOSIS — Z720 Tobacco use: Secondary | ICD-10-CM

## 2024-07-18 DIAGNOSIS — E669 Obesity, unspecified: Secondary | ICD-10-CM | POA: Diagnosis not present

## 2024-07-18 DIAGNOSIS — Z0000 Encounter for general adult medical examination without abnormal findings: Secondary | ICD-10-CM

## 2024-07-18 MED ORDER — METFORMIN HCL ER 500 MG PO TB24: 500.0000 mg | ORAL_TABLET | Freq: Every day | ORAL | 1 refills | Status: DC

## 2024-07-18 NOTE — Patient Instructions (Addendum)
 Please ask Insurance if there coverage for obesity management with BMI 33  -Wegovy  -Zepbound  -Contrave  -Qsymia   It was a pleasure to see you today!  Thank you for choosing [facility] for your primary care.   Today you were seen for your annual physical  Please review the attached information regarding helpful preventive health topics.    Best Wishes,   Dr. Lang

## 2024-07-18 NOTE — Progress Notes (Signed)
 Complete physical exam   Patient: Allison Navarro   DOB: Jul 03, 1986   38 y.o. Female  MRN: [id number] Visit Date: 07/18/2024  Today's healthcare provider: Rockie Agent, MD   Chief Complaint  Patient presents with   Annual Exam    Patient is present for annual exam with pcp.  Diet is normal and well balanced per patient. Patient is doing walking some days.  Vaccines: Hep B, flu, prevnar, tdap- declines all  Screenings: HIV, Hep C- declines    Subjective    Allison Navarro is a 38 y.o. female who presents today for a complete physical exam.    She does have additional problems to discuss today.   Discussed the use of AI scribe software for clinical note transcription with the patient, who gave verbal consent to proceed.  History of Present Illness Allison Navarro is a 38 year old female who presents for an annual physical exam.  A few weeks ago, she experienced an episode at work characterized by dizziness and feeling hot. She visited urgent care, where no diagnostic tests were performed. No similar episodes have occurred since.  Recent blood work revealed elevated testosterone levels. She has had hirsutism since age 78 or 54. Her A1c was 6.2, and her cholesterol was 242 with an LDL of 185. Potassium was slightly elevated at 5.5, and ALT was slightly elevated as well.  She walks 8 to 10 miles on some days due to her job but is more sedentary on other days. Her diet includes a lot of bread despite eating many vegetables. She is trying to quit smoking and is currently smoking less than a quarter of a pack a day, with further reduction when she remembers to take Chantix .  Her mother is a designer, jewellery, and her aunt recently experienced a similar episode of dizziness, which was attributed to low potassium. She was concerned about her potassium levels after her episode, but her results showed elevated potassium instead.  She has declined the COVID, flu, and tetanus  vaccines and is not interested in the pneumococcal vaccine despite a history of snoring. She is working on reducing her tobacco use, which has improved since her last visit.     Past Medical History:  Diagnosis Date   Arthritis    lower back   Diverticulitis    Past Surgical History:  Procedure Laterality Date   COLONOSCOPY WITH PROPOFOL  N/A 01/06/2022   Procedure: COLONOSCOPY WITH PROPOFOL ;  Surgeon: Jinny Carmine, MD;  Location: [facility] END[facility];  Service: Endoscopy;  Laterality: N/A;   NO PAST SURGERIES     Social History   Socioeconomic History   Marital status: Married    Spouse name: steven Suarez   Number of children: 2   Years of education: Not on file   Highest education level: GED or equivalent  Occupational History   Not on file  Tobacco Use   Smoking status: Every Day    Current packs/day: 0.50    Average packs/day: 0.5 packs/day for 26.5 years (13.3 ttl pk-yrs)    Types: Cigarettes    Start date: 01/12/1998   Smokeless tobacco: Never   Tobacco comments:    Been smoking since 38 years old  Vaping Use   Vaping status: Never Used  Substance and Sexual Activity   Alcohol use: Not Currently   Drug use: Never   Sexual activity: Yes    Birth control/protection: I.U.D.  Other Topics Concern   Not on file  Social History Narrative  Not on file   Social Drivers of Health   Financial Resource Strain: Low Risk  (01/13/2024)   Overall Financial Resource Strain (CARDIA)    Difficulty of Paying Living Expenses: Not hard at all  Food Insecurity: No Food Insecurity (01/13/2024)   Hunger Vital Sign    Worried About Running Out of Food in the Last Year: Never true    Ran Out of Food in the Last Year: Never true  Transportation Needs: No Transportation Needs (01/13/2024)   PRAPARE - Administrator, Civil Service (Medical): No    Lack of Transportation (Non-Medical): No  Physical Activity: Sufficiently Active (01/13/2024)   Exercise Vital Sign    Days of  Exercise per Week: 7 days    Minutes of Exercise per Session: 30 min  Stress: No Stress Concern Present (01/13/2024)   [employer] of Occupational Health - Occupational Stress Questionnaire    Feeling of Stress : Not at all  Social Connections: Not on file  Intimate Partner Violence: Not At Risk (01/13/2024)   Humiliation, Afraid, Rape, and Kick questionnaire    Fear of Current or Ex-Partner: No    Emotionally Abused: No    Physically Abused: No    Sexually Abused: No   Family Status  Relation Name Status   Mother  Alive   Father  Deceased  No partnership data on file   History reviewed. No pertinent family history. No Known Allergies   Medications: Outpatient Medications Prior to Visit  Medication Sig   glycopyrrolate  (ROBINUL ) 2 MG tablet Take 1 tablet (2 mg total) by mouth 3 (three) times daily.   levonorgestrel (MIRENA) 20 MCG/DAY IUD 1 each by Intrauterine route once.   Varenicline  Tartrate, Starter, (CHANTIX  STARTING MONTH PAK) 0.5 MG X 11 & 1 MG X 42 TBPK Days 1 to 3: Take 0.5 mg by mouth once daily. Days 4 to 7: Take 0.5 mg twice daily. Day 8 (and later): 1 mg twice daily   No facility-administered medications prior to visit.    Review of Systems  Last metabolic panel Lab Results  Component Value Date   GLUCOSE 116 (H) 06/09/2024   NA 136 06/09/2024   K 5.5 (H) 06/09/2024   CL 100 06/09/2024   CO2 20 06/09/2024   BUN 12 06/09/2024   CREATININE 0.88 06/09/2024   EGFR 86 06/09/2024   CALCIUM  9.9 06/09/2024   PROT 7.1 06/09/2024   ALBUMIN 4.5 06/09/2024   LABGLOB 2.6 06/09/2024   BILITOT 0.2 06/09/2024   ALKPHOS 86 06/09/2024   AST 34 06/09/2024   ALT 47 (H) 06/09/2024   Last lipids Lab Results  Component Value Date   CHOL 242 (H) 06/09/2024   HDL 30 (L) 06/09/2024   LDLCALC 185 (H) 06/09/2024   TRIG 145 06/09/2024   CHOLHDL 8.1 (H) 06/09/2024    Last hemoglobin A1c Lab Results  Component Value Date   HGBA1C 6.2 (H) 06/09/2024   Last thyroid  functions Lab Results  Component Value Date   TSH 1.700 06/09/2024   Last vitamin D No results found for: 25OHVITD2, 25OHVITD3, VD25OH Last vitamin B12 and Folate No results found for: VITAMINB12, FOLATE     Objective    BP 122/77 (BP Location: Right Arm, Patient Position: Sitting, Cuff Size: Normal)   Pulse 66   Temp 98.6 F (37 C) (Oral)   Ht 5' (1.524 m)   Wt 173 lb (78.5 kg)   LMP  (LMP Unknown)   SpO2 99%  BMI 33.79 kg/m   BP Readings from Last 3 Encounters:  07/18/24 122/77  07/12/24 110/80  06/09/24 121/79   Wt Readings from Last 3 Encounters:  07/18/24 173 lb (78.5 kg)  07/12/24 174 lb (78.9 kg)  06/09/24 171 lb 12.8 oz (77.9 kg)        Physical Exam Vitals reviewed.  Constitutional:      General: She is not in acute distress.    Appearance: Normal appearance. She is not ill-appearing, toxic-appearing or diaphoretic.  HENT:     Head: Normocephalic and atraumatic.     Right Ear: Tympanic membrane and external ear normal. There is no impacted cerumen.     Left Ear: Tympanic membrane and external ear normal. There is no impacted cerumen.     Nose: Nose normal.     Mouth/Throat:     Pharynx: Oropharynx is clear.  Eyes:     General: No scleral icterus.    Extraocular Movements: Extraocular movements intact.     Conjunctiva/sclera: Conjunctivae normal.     Pupils: Pupils are equal, round, and reactive to light.  Cardiovascular:     Rate and Rhythm: Normal rate and regular rhythm.     Pulses: Normal pulses.     Heart sounds: Normal heart sounds. No murmur heard.    No friction rub. No gallop.  Pulmonary:     Effort: Pulmonary effort is normal. No respiratory distress.     Breath sounds: Normal breath sounds. No wheezing, rhonchi or rales.  Abdominal:     General: Bowel sounds are normal. There is no distension.     Palpations: Abdomen is soft. There is no mass.     Tenderness: There is no abdominal tenderness. There is no guarding.   Musculoskeletal:        General: No deformity.     Cervical back: Normal range of motion and neck supple.     Right lower leg: No edema.     Left lower leg: No edema.  Lymphadenopathy:     Cervical: No cervical adenopathy.  Skin:    General: Skin is warm.     Capillary Refill: Capillary refill takes less than 2 seconds.     Findings: No erythema or rash.  Neurological:     General: No focal deficit present.     Mental Status: She is alert and oriented to person, place, and time.     Cranial Nerves: Cranial nerves 2-12 are intact. No cranial nerve deficit or facial asymmetry.     Motor: Motor function is intact. No weakness.     Gait: Gait normal.  Psychiatric:        Mood and Affect: Mood normal.        Behavior: Behavior normal.       Last depression screening scores    07/18/2024    3:44 PM 01/13/2024    3:27 PM 10/08/2021    4:01 PM  PHQ 2/9 Scores  PHQ - 2 Score 0 0 0  PHQ- 9 Score 1      Last fall risk screening    07/18/2024    3:44 PM  Fall Risk   Falls in the past year? 0  Number falls in past yr: 0  Injury with Fall? 0  Risk for fall due to : No Fall Risks  Follow up Falls evaluation completed    Last Audit-C alcohol use screening    01/13/2024    3:23 PM  Alcohol Use Disorder Test (AUDIT)  1.  How often do you have a drink containing alcohol? 1  2. How many drinks containing alcohol do you have on a typical day when you are drinking? 0  3. How often do you have six or more drinks on one occasion? 0  AUDIT-C Score 1   A score of 3 or more in women, and 4 or more in men indicates increased risk for alcohol abuse, EXCEPT if all of the points are from question 1   No results found for any visits on 07/18/24.  Assessment & Plan    Routine Health Maintenance and Physical Exam   There is no immunization history on file for this patient.  Health Maintenance  Topic Date Due   COVID-19 Vaccine (1 - 2025-26 season) 08/03/2024 (Originally 05/15/2024)    Influenza Vaccine  12/12/2024 (Originally 04/14/2024)   DTaP/Tdap/Td (1 - Tdap) 07/18/2025 (Originally 01/02/2004)   Pneumococcal Vaccine (1 of 2 - PCV) 07/18/2025 (Originally 01/02/2004)   Hepatitis B Vaccines 19-59 Average Risk (1 of 3 - 19+ 3-dose series) 07/18/2025 (Originally 01/02/2004)   HPV VACCINES (1 - 3-dose SCDM series) 07/18/2025 (Originally 01/02/2012)   Hepatitis C Screening  07/18/2025 (Originally 01/02/2003)   HIV Screening  07/18/2025 (Originally 01/02/2000)   Cervical Cancer Screening (HPV/Pap Cotest)  11/27/2026   Meningococcal B Vaccine  Aged Out    Problem List Items Addressed This Visit     Dyslipidemia   Obesity (BMI 30-39.9)   Relevant Medications   metFORMIN (GLUCOPHAGE-XR) 500 MG 24 hr tablet   Other Relevant Orders   CMP14+EGFR   Tobacco use   Relevant Orders   CMP14+EGFR   Other Visit Diagnoses       Annual physical exam    -  Primary     Hyperkalemia       Relevant Orders   CMP14+EGFR     Prediabetes       Relevant Medications   metFORMIN (GLUCOPHAGE-XR) 500 MG 24 hr tablet       Assessment and Plan Assessment & Plan Adult Wellness Visit Routine adult wellness visit with no acute concerns. Blood pressure is well-controlled. Pap smear due in 2028. Declined COVID, flu, and tetanus vaccines. Pneumococcal vaccine recommended due to snoring history. - Continue routine wellness visits - Recommended pneumococcal vaccine  Obesity Chronic  BMI is 33. Reports difficulty losing weight despite regular physical activity and dietary efforts. Discussed potential pharmacological interventions including metformin and GLP-1 receptor agonists like Wegovy and Zetban. Metformin may aid in weight loss and improve A1c. GLP-1 receptor agonists can result in 10-20% weight loss. Insurance coverage for GLP-1 receptor agonists needs verification. - Started metformin 500mg  daily  - Instructed to check insurance coverage for GLP-1 receptor agonists - Encouraged dietary changes  and regular exercise  Hyperlipidemia Chronic  Cholesterol level is 242 mg/dL with LDL at 814 mg/dL. Discussed dietary modifications to reduce LDL levels, including reducing intake of butters, oils, fried foods, and red meat. Exercise is encouraged to aid in lowering cholesterol levels. - Implement dietary changes to reduce LDL - Encouraged regular exercise - Will recheck cholesterol levels in 3 months  Prediabetes Chronic  A1c is 6.2%, indicating prediabetes. Discussed the importance of lifestyle modifications to prevent progression to diabetes. Metformin may help lower A1c and aid in weight loss. - Started metformin 500mg  daily  - Will recheck A1c in 3 months  Hyperkalemia Potassium level is elevated at 5.5 mmol/L. Discussed potential causes including dehydration and dietary factors. Plan to recheck potassium levels to assess for  resolution. - Ordered complete metabolic panel to recheck potassium levels  Tobacco use Currently smoking less than a quarter of a pack per day. Using Chantix  to aid in smoking cessation. Encouraged continued efforts to quit smoking. - Continue Chantix  - Encouraged smoking cessation efforts       Return in about 3 months (around 10/18/2024) for Cholesterol,preDM.       Rockie Agent, MD  [facility] [phone] (phone) [phone] (fax)  [facility]

## 2024-07-19 ENCOUNTER — Ambulatory Visit: Payer: Self-pay | Admitting: Family Medicine

## 2024-07-19 ENCOUNTER — Telehealth: Payer: Self-pay

## 2024-07-19 DIAGNOSIS — E669 Obesity, unspecified: Secondary | ICD-10-CM

## 2024-07-19 LAB — CMP14+EGFR
ALT: 38 IU/L — ABNORMAL HIGH (ref 0–32)
AST: 29 IU/L (ref 0–40)
Albumin: 4.6 g/dL (ref 3.9–4.9)
Alkaline Phosphatase: 78 IU/L (ref 41–116)
BUN/Creatinine Ratio: 13 (ref 9–23)
BUN: 10 mg/dL (ref 6–20)
Bilirubin Total: 0.4 mg/dL (ref 0.0–1.2)
CO2: 21 mmol/L (ref 20–29)
Calcium: 9.6 mg/dL (ref 8.7–10.2)
Chloride: 102 mmol/L (ref 96–106)
Creatinine, Ser: 0.79 mg/dL (ref 0.57–1.00)
Globulin, Total: 2.4 g/dL (ref 1.5–4.5)
Glucose: 87 mg/dL (ref 70–99)
Potassium: 4.6 mmol/L (ref 3.5–5.2)
Sodium: 136 mmol/L (ref 134–144)
Total Protein: 7 g/dL (ref 6.0–8.5)
eGFR: 98 mL/min/1.73 (ref >59)

## 2024-07-19 NOTE — Telephone Encounter (Signed)
 Copied from CRM [id number]. Topic: Clinical - Prescription Issue >> Jul 19, 2024 11:05 AM Montie POUR wrote: Reason for CRM:  Dr. Sharma wanted to know what medications Allison Navarro's insurance will cover for weight loss.  Here is the list: Liraglutide  Orlistat  Saxenda Please send Allison Navarro a message through MyChart with any questions.

## 2024-07-20 NOTE — Telephone Encounter (Signed)
 MyChart message sent to patient to discuss medications listed

## 2024-07-21 ENCOUNTER — Other Ambulatory Visit (HOSPITAL_COMMUNITY): Payer: Self-pay

## 2024-07-21 MED ORDER — LIRAGLUTIDE -WEIGHT MANAGEMENT 18 MG/3ML ~~LOC~~ SOPN: PEN_INJECTOR | SUBCUTANEOUS | 0 refills | Status: DC

## 2024-07-21 NOTE — Addendum Note (Signed)
 Addended by: SIMMONS-ROBINSON, Sunny Aguon L on: 07/21/2024 09:08 AM   Modules accepted: Orders

## 2024-07-21 NOTE — Telephone Encounter (Signed)
 PA has been submitted and is currently pending, thank you.

## 2024-07-24 ENCOUNTER — Other Ambulatory Visit (HOSPITAL_COMMUNITY): Payer: Self-pay

## 2024-07-27 ENCOUNTER — Other Ambulatory Visit (HOSPITAL_COMMUNITY): Payer: Self-pay

## 2024-07-27 ENCOUNTER — Telehealth: Payer: Self-pay

## 2024-07-27 NOTE — Telephone Encounter (Signed)
 PA request has been Received. New Encounter has been or will be created for follow up. For additional info see Pharmacy Prior Auth telephone encounter from 07/27/24.

## 2024-07-27 NOTE — Telephone Encounter (Signed)
 Copied from CRM #[id number]. Topic: Clinical - Medication Prior Auth >> Jul 27, 2024 11:21 AM Willma SAUNDERS wrote: Reason for CRM: Patient states the pharmacy advise that patients insurance is requiring prior authorization for Liraglutide -Weight Management (SAXENDA) 18 MG/3ML SOPN .  Patient can be reached at [phone]

## 2024-08-15 ENCOUNTER — Other Ambulatory Visit (HOSPITAL_COMMUNITY): Payer: Self-pay

## 2024-08-15 ENCOUNTER — Telehealth: Payer: Self-pay

## 2024-08-15 NOTE — Telephone Encounter (Signed)
 Pharmacy Patient Advocate Encounter   Received notification from Onbase that prior authorization for Liraglutide  -Weight Management (SAXENDA ) 18 MG/71M  is required/requested.   Insurance verification completed.   The patient is insured through [facility].   Per test claim: PA required; PA submitted to above mentioned insurance via Latent Key/confirmation #/EOC [id number] Status is pending

## 2024-08-16 NOTE — Telephone Encounter (Signed)
 Pharmacy Patient Advocate Encounter  Received notification from [facility] that Prior Authorization for Liraglutide  - weight management 1/mg/74ml has been APPROVED from 07/16/24 to 02/11/25   PA #/Case ID/Reference #: [id number]  Approval letter indexed to media tab

## 2024-09-18 ENCOUNTER — Other Ambulatory Visit: Payer: Self-pay | Admitting: Family Medicine

## 2024-09-18 DIAGNOSIS — E669 Obesity, unspecified: Secondary | ICD-10-CM

## 2024-10-18 ENCOUNTER — Encounter: Payer: Self-pay | Admitting: Family Medicine

## 2024-10-18 ENCOUNTER — Ambulatory Visit: Admitting: Family Medicine

## 2024-10-18 VITALS — BP 109/76 | HR 75 | Ht 60.0 in | Wt 171.9 lb

## 2024-10-18 DIAGNOSIS — E785 Hyperlipidemia, unspecified: Secondary | ICD-10-CM

## 2024-10-18 DIAGNOSIS — Z720 Tobacco use: Secondary | ICD-10-CM

## 2024-10-18 DIAGNOSIS — R7303 Prediabetes: Secondary | ICD-10-CM

## 2024-10-18 DIAGNOSIS — R748 Abnormal levels of other serum enzymes: Secondary | ICD-10-CM | POA: Diagnosis not present

## 2024-10-18 DIAGNOSIS — E669 Obesity, unspecified: Secondary | ICD-10-CM | POA: Diagnosis not present

## 2024-10-18 MED ORDER — LIRAGLUTIDE -WEIGHT MANAGEMENT 18 MG/3ML ~~LOC~~ SOPN: 2.4000 mg | PEN_INJECTOR | SUBCUTANEOUS | Status: AC

## 2024-10-18 NOTE — Assessment & Plan Note (Signed)
 Chronic  Last LDL was 185 mg/dL, HDL was low at 30 mg/dL, and total cholesterol was high at 242 mg/dL. ASCVD score is unable to be calculated. - Will repeat lipid panel

## 2024-10-18 NOTE — Assessment & Plan Note (Signed)
 Chronic  BMI is 33.57. Weight has decreased by 2 pounds since last visit and 3 pounds since October. Liraglutide  was used for weight loss but was reduced to 2-3 times a week due to initial gastrointestinal upset. - Changed frequency for liraglutide  2.4mg  to Monday, Wednesday, Friday dosing

## 2024-10-18 NOTE — Progress Notes (Signed)
 "  Established Patient Office Visit  Patient ID: Allison Navarro, female    DOB: April 23, 1986  Age: 39 y.o. MRN: [id number] PCP: Sharma Coyer, MD  Chief Complaint  Patient presents with   Medical Management of Chronic Issues    Patient is present for f/u with pcp for cholesterol, pre diabetes.   Patient did stop metformin  due to stomach issues, has lessened use of liraglutide  (2-3x week but has not had in 2 weeks) feels fine on this now but lessened dose due to feeling sick after the first time she took it.     Subjective:     HPI  Discussed the use of AI scribe software for clinical note transcription with the patient, who gave verbal consent to proceed.  History of Present Illness Allison Navarro is a 39 year old female with diabetes and hyperlipidemia who presents for follow-up.  Her last laboratory results showed a mildly elevated alanine transaminase (ALT) enzyme level of 38 U/L. Her LDL cholesterol was 185 mg/dL, HDL was low at 30 mg/dL, and total cholesterol was high at 242 mg/dL. Her hemoglobin A1c was 6.2%. Her BMI is 33.57, and she has lost two pounds since her last visit, totaling a three-pound loss since October.  She has been using liraglutide  2.4 mg daily for weight loss but has only been taking it two to three times a week and has not used it in the past two weeks. She initially reduced the dose due to feeling sick. She had to stop metformin  500 mg due to gastrointestinal upset. She is not currently taking Chantix .     ROS    Objective:     BP 109/76 (BP Location: Right Arm, Patient Position: Sitting, Cuff Size: Normal)   Pulse 75   Ht 5' (1.524 m)   Wt 171 lb 14.4 oz (78 kg)   SpO2 99%   BMI 33.57 kg/m  BP Readings from Last 3 Encounters:  10/18/24 109/76  07/18/24 122/77  07/12/24 110/80   Wt Readings from Last 3 Encounters:  10/18/24 171 lb 14.4 oz (78 kg)  07/18/24 173 lb (78.5 kg)  07/12/24 174 lb (78.9 kg)      Physical  Exam Vitals reviewed.  Constitutional:      General: She is not in acute distress.    Appearance: Normal appearance. She is not ill-appearing.  Cardiovascular:     Rate and Rhythm: Normal rate and regular rhythm.  Pulmonary:     Effort: Pulmonary effort is normal. No respiratory distress.     Breath sounds: No wheezing, rhonchi or rales.  Neurological:     Mental Status: She is alert and oriented to person, place, and time.  Psychiatric:        Mood and Affect: Mood normal.        Behavior: Behavior normal.      No results found for any visits on 10/18/24.  Last metabolic panel Lab Results  Component Value Date   GLUCOSE 87 07/18/2024   NA 136 07/18/2024   K 4.6 07/18/2024   CL 102 07/18/2024   CO2 21 07/18/2024   BUN 10 07/18/2024   CREATININE 0.79 07/18/2024   EGFR 98 07/18/2024   CALCIUM  9.6 07/18/2024   PROT 7.0 07/18/2024   ALBUMIN 4.6 07/18/2024   LABGLOB 2.4 07/18/2024   BILITOT 0.4 07/18/2024   ALKPHOS 78 07/18/2024   AST 29 07/18/2024   ALT 38 (H) 07/18/2024   Last lipids Lab Results  Component Value Date  CHOL 242 (H) 06/09/2024   HDL 30 (L) 06/09/2024   LDLCALC 185 (H) 06/09/2024   TRIG 145 06/09/2024   CHOLHDL 8.1 (H) 06/09/2024   The ASCVD Risk score (Arnett DK, et al., 2019) failed to calculate for the following reasons:   The 2019 ASCVD risk score is only valid for ages 41 to 76  Last hemoglobin A1c Lab Results  Component Value Date   HGBA1C 6.2 (H) 06/09/2024      The ASCVD Risk score (Arnett DK, et al., 2019) failed to calculate for the following reasons:   The 2019 ASCVD risk score is only valid for ages 65 to 54  Outpatient Encounter Medications as of 10/18/2024  Medication Sig Note   glycopyrrolate  (ROBINUL ) 2 MG tablet Take 1 tablet (2 mg total) by mouth 3 (three) times daily.    levonorgestrel (MIRENA) 20 MCG/DAY IUD 1 each by Intrauterine route once.    [DISCONTINUED] Liraglutide  -Weight Management 18 MG/3ML SOPN Inject 2.4 mg  into the skin daily.    Liraglutide  -Weight Management 18 MG/3ML SOPN Inject 2.4 mg into the skin every Monday, Wednesday, and Friday.    [DISCONTINUED] metFORMIN  (GLUCOPHAGE -XR) 500 MG 24 hr tablet Take 1 tablet (500 mg total) by mouth daily with breakfast. (Patient not taking: Reported on 10/18/2024) 10/18/2024: GI upset SE   [DISCONTINUED] Varenicline  Tartrate, Starter, (CHANTIX  STARTING MONTH PAK) 0.5 MG X 11 & 1 MG X 42 TBPK Days 1 to 3: Take 0.5 mg by mouth once daily. Days 4 to 7: Take 0.5 mg twice daily. Day 8 (and later): 1 mg twice daily (Patient not taking: Reported on 10/18/2024)    No facility-administered encounter medications on file as of 10/18/2024.       Assessment & Plan:   Problem List Items Addressed This Visit     Dyslipidemia - Primary   Chronic  Last LDL was 185 mg/dL, HDL was low at 30 mg/dL, and total cholesterol was high at 242 mg/dL. ASCVD score is unable to be calculated. - Will repeat lipid panel      Relevant Orders   Lipid panel   Obesity (BMI 30-39.9)   Chronic  BMI is 33.57. Weight has decreased by 2 pounds since last visit and 3 pounds since October. Liraglutide  was used for weight loss but was reduced to 2-3 times a week due to initial gastrointestinal upset. - Changed frequency for liraglutide  2.4mg  to Monday, Wednesday, Friday dosing      Relevant Medications   Liraglutide  -Weight Management 18 MG/3ML SOPN   Tobacco use   Chronic  Continued recommendation for smoking cessation. Chantix  is not being taken. - Stopped Chantix  -counseled on smoking cessation for 3 mins      Other Visit Diagnoses       Prediabetes       Relevant Orders   Hemoglobin A1c     Elevated liver enzymes       Relevant Orders   CMP14+EGFR       Assessment and Plan Assessment & Plan   Prediabetes Last hemoglobin A1c was 6.2%, indicating prediabetes. Metformin  was discontinued due to gastrointestinal upset. - Will repeat hemoglobin A1c  Elevated liver  enzymes Mildly elevated ALT enzyme at 38 U/L. - Will repeat liver function tests    No follow-ups on file.    Rockie Agent, MD [facility]  "

## 2024-10-18 NOTE — Assessment & Plan Note (Signed)
 Chronic  Continued recommendation for smoking cessation. Chantix  is not being taken. - Stopped Chantix  -counseled on smoking cessation for 3 mins
